# Patient Record
Sex: Female | Born: 1980 | Race: White | Hispanic: No | Marital: Married | Smoking: Never smoker
Health system: Southern US, Community
[De-identification: ages and names within clinical notes are randomized; demographics above are authoritative.]

## PROBLEM LIST (undated history)

## (undated) DIAGNOSIS — J45909 Unspecified asthma, uncomplicated: Secondary | ICD-10-CM

## (undated) DIAGNOSIS — B019 Varicella without complication: Secondary | ICD-10-CM

## (undated) DIAGNOSIS — F32A Depression, unspecified: Secondary | ICD-10-CM

## (undated) DIAGNOSIS — D649 Anemia, unspecified: Secondary | ICD-10-CM

## (undated) DIAGNOSIS — F329 Major depressive disorder, single episode, unspecified: Secondary | ICD-10-CM

## (undated) DIAGNOSIS — K5080 Crohn's disease of both small and large intestine without complications: Secondary | ICD-10-CM

## (undated) DIAGNOSIS — T7840XA Allergy, unspecified, initial encounter: Secondary | ICD-10-CM

## (undated) DIAGNOSIS — Z8669 Personal history of other diseases of the nervous system and sense organs: Secondary | ICD-10-CM

## (undated) DIAGNOSIS — R011 Cardiac murmur, unspecified: Secondary | ICD-10-CM

## (undated) HISTORY — DX: Cardiac murmur, unspecified: R01.1

## (undated) HISTORY — DX: Personal history of other diseases of the nervous system and sense organs: Z86.69

## (undated) HISTORY — PX: COLONOSCOPY W/ BIOPSIES: SHX1374

## (undated) HISTORY — DX: Unspecified asthma, uncomplicated: J45.909

## (undated) HISTORY — PX: COLONOSCOPY: SHX174

## (undated) HISTORY — DX: Major depressive disorder, single episode, unspecified: F32.9

## (undated) HISTORY — DX: Anemia, unspecified: D64.9

## (undated) HISTORY — PX: MOUTH SURGERY: SHX715

## (undated) HISTORY — PX: MULTIPLE TOOTH EXTRACTIONS: SHX2053

## (undated) HISTORY — DX: Allergy, unspecified, initial encounter: T78.40XA

## (undated) HISTORY — DX: Depression, unspecified: F32.A

## (undated) HISTORY — DX: Varicella without complication: B01.9

## (undated) HISTORY — DX: Crohn's disease of both small and large intestine without complications: K50.80

---

## 1999-04-05 ENCOUNTER — Other Ambulatory Visit: Admission: RE | Admit: 1999-04-05 | Discharge: 1999-04-05 | Payer: Self-pay | Admitting: *Deleted

## 1999-12-14 ENCOUNTER — Ambulatory Visit: Admission: RE | Admit: 1999-12-14 | Discharge: 1999-12-14 | Payer: Self-pay | Admitting: Family Medicine

## 2002-08-18 ENCOUNTER — Other Ambulatory Visit: Admission: RE | Admit: 2002-08-18 | Discharge: 2002-08-18 | Payer: Self-pay | Admitting: Obstetrics and Gynecology

## 2003-07-05 ENCOUNTER — Other Ambulatory Visit: Admission: RE | Admit: 2003-07-05 | Discharge: 2003-07-05 | Payer: Self-pay | Admitting: Obstetrics and Gynecology

## 2004-07-16 ENCOUNTER — Other Ambulatory Visit: Admission: RE | Admit: 2004-07-16 | Discharge: 2004-07-16 | Payer: Self-pay | Admitting: Obstetrics and Gynecology

## 2005-08-21 ENCOUNTER — Other Ambulatory Visit: Admission: RE | Admit: 2005-08-21 | Discharge: 2005-08-21 | Payer: Self-pay | Admitting: Obstetrics and Gynecology

## 2006-07-28 ENCOUNTER — Other Ambulatory Visit: Admission: RE | Admit: 2006-07-28 | Discharge: 2006-07-28 | Payer: Self-pay | Admitting: Obstetrics and Gynecology

## 2009-11-23 ENCOUNTER — Inpatient Hospital Stay (HOSPITAL_COMMUNITY): Admission: AD | Admit: 2009-11-23 | Discharge: 2009-11-23 | Payer: Self-pay | Admitting: Obstetrics and Gynecology

## 2010-01-02 ENCOUNTER — Inpatient Hospital Stay (HOSPITAL_COMMUNITY): Admission: AD | Admit: 2010-01-02 | Discharge: 2010-01-07 | Payer: Self-pay | Admitting: Obstetrics and Gynecology

## 2010-01-04 ENCOUNTER — Encounter (HOSPITAL_COMMUNITY): Payer: Self-pay | Admitting: Obstetrics and Gynecology

## 2010-01-07 ENCOUNTER — Encounter: Admission: RE | Admit: 2010-01-07 | Discharge: 2010-02-06 | Payer: Self-pay | Admitting: Obstetrics and Gynecology

## 2010-02-07 ENCOUNTER — Encounter: Admission: RE | Admit: 2010-02-07 | Discharge: 2010-03-09 | Payer: Self-pay | Admitting: Obstetrics and Gynecology

## 2010-03-10 ENCOUNTER — Encounter: Admission: RE | Admit: 2010-03-10 | Discharge: 2010-04-09 | Payer: Self-pay | Admitting: Obstetrics and Gynecology

## 2010-04-10 ENCOUNTER — Encounter: Admission: RE | Admit: 2010-04-10 | Discharge: 2010-05-10 | Payer: Self-pay | Admitting: Obstetrics and Gynecology

## 2010-05-11 ENCOUNTER — Encounter: Admission: RE | Admit: 2010-05-11 | Discharge: 2010-06-10 | Payer: Self-pay | Admitting: Obstetrics and Gynecology

## 2010-06-11 ENCOUNTER — Encounter: Admission: RE | Admit: 2010-06-11 | Discharge: 2010-06-13 | Payer: Self-pay | Admitting: Obstetrics and Gynecology

## 2010-07-12 ENCOUNTER — Encounter
Admission: RE | Admit: 2010-07-12 | Discharge: 2010-08-11 | Payer: Self-pay | Source: Home / Self Care | Admitting: Obstetrics and Gynecology

## 2010-08-12 ENCOUNTER — Encounter
Admission: RE | Admit: 2010-08-12 | Discharge: 2010-08-31 | Payer: Self-pay | Source: Home / Self Care | Attending: Obstetrics and Gynecology | Admitting: Obstetrics and Gynecology

## 2010-10-14 ENCOUNTER — Encounter (HOSPITAL_COMMUNITY): Payer: Self-pay | Admitting: Obstetrics and Gynecology

## 2010-12-11 LAB — CBC
HCT: 29.7 % — ABNORMAL LOW (ref 36.0–46.0)
Hemoglobin: 10 g/dL — ABNORMAL LOW (ref 12.0–15.0)
MCHC: 33.6 g/dL (ref 30.0–36.0)
MCV: 89.9 fL (ref 78.0–100.0)
WBC: 17.8 10*3/uL — ABNORMAL HIGH (ref 4.0–10.5)

## 2010-12-12 LAB — CBC
HCT: 36.6 % (ref 36.0–46.0)
Hemoglobin: 12.2 g/dL (ref 12.0–15.0)
MCV: 88.9 fL (ref 78.0–100.0)
RBC: 4.12 MIL/uL (ref 3.87–5.11)
RDW: 14.5 % (ref 11.5–15.5)

## 2010-12-12 LAB — URINALYSIS, MICROSCOPIC ONLY
Bilirubin Urine: NEGATIVE
Nitrite: NEGATIVE
Protein, ur: NEGATIVE mg/dL
Specific Gravity, Urine: <1.005 — ABNORMAL LOW (ref 1.005–1.030)

## 2010-12-12 LAB — SAMPLE TO BLOOD BANK

## 2010-12-17 LAB — FETAL FIBRONECTIN: Fetal Fibronectin: NEGATIVE

## 2012-05-04 ENCOUNTER — Encounter: Payer: Self-pay | Admitting: Internal Medicine

## 2012-06-03 ENCOUNTER — Other Ambulatory Visit (INDEPENDENT_AMBULATORY_CARE_PROVIDER_SITE_OTHER): Payer: 59

## 2012-06-03 ENCOUNTER — Ambulatory Visit (INDEPENDENT_AMBULATORY_CARE_PROVIDER_SITE_OTHER): Payer: 59 | Admitting: Internal Medicine

## 2012-06-03 ENCOUNTER — Encounter: Payer: Self-pay | Admitting: Internal Medicine

## 2012-06-03 VITALS — BP 114/66 | HR 64 | Ht 66.0 in | Wt 140.8 lb

## 2012-06-03 DIAGNOSIS — K5080 Crohn's disease of both small and large intestine without complications: Secondary | ICD-10-CM

## 2012-06-03 DIAGNOSIS — Z92241 Personal history of systemic steroid therapy: Secondary | ICD-10-CM

## 2012-06-03 HISTORY — DX: Crohn's disease of both small and large intestine without complications: K50.80

## 2012-06-03 LAB — CBC WITH DIFFERENTIAL/PLATELET
Eosinophils Absolute: 0.1 10*3/uL (ref 0.0–0.7)
HCT: 40 % (ref 36.0–46.0)
MCHC: 32.5 g/dL (ref 30.0–36.0)
MCV: 88.3 fl (ref 78.0–100.0)
Monocytes Relative: 8.9 % (ref 3.0–12.0)
Neutro Abs: 2.9 10*3/uL (ref 1.4–7.7)
Neutrophils Relative %: 47 % (ref 43.0–77.0)
RBC: 4.53 Mil/uL (ref 3.87–5.11)
WBC: 6.2 10*3/uL (ref 4.5–10.5)

## 2012-06-03 MED ORDER — MESALAMINE 1.2 G PO TBEC: 1.2000 g | DELAYED_RELEASE_TABLET | Freq: Two times a day (BID) | ORAL | Status: DC

## 2012-06-03 MED ORDER — MESALAMINE 1.2 G PO TBEC: 2400.0000 mg | DELAYED_RELEASE_TABLET | Freq: Every day | ORAL | Status: DC

## 2012-06-03 NOTE — Progress Notes (Signed)
Subjective:    Patient ID: Shannon Singh, female    DOB: 1981/09/05, 31 y.o.   MRN: [id number]  HPI This is a very pleasant 31 year old married white woman with a history of Crohn's disease. She had problems with diarrhea and abdominal pain starting around 2006 or 2007. She in for colonoscopy on 01/28/2007. Findings showed colitis from about 10-40 cm with patches of ulcerations and erosions, 2 erosions in the mid right colon, a few erosions in the cecum, and a small erosions in the terminal ileum, just to. Biopsies confirmed inflammatory bowel disease. There was some question as to whether this was Crohn's disease or ulcerative colitis with backwash ileitis. She was treated with mesalamine and did have some episodes of needing steroids in the first year or 2. Since then released more recently she has been on Lialda 2.4 g daily. When she developed joint pains in the knees and hips she will then ulcer have diarrhea and take 4.8 g daily for about a week. She's had to do that perhaps twice in the last year. She is not having diarrhea or rectal bleeding now. She has not had any high manifestations were skin manifestations of inflammatory bowel disease. In general she feels like she is in good health and only needs to see primary care when having problems.  Her primary care physician is Dr. Rachell Cipro and her gynecologist is Dr. Marylynn Pearson area  GI review of systems is otherwise negative.  No Known Allergies Outpatient Prescriptions Prior to Visit  Medication Sig Dispense Refill  . cetirizine (ZYRTEC) 10 MG tablet Take 10 mg by mouth daily.      . drospirenone-ethinyl estradiol (YASMIN,ZARAH,SYEDA) 3-0.03 MG tablet Take 1 tablet by mouth daily.      . mesalamine (LIALDA) 1.2 G EC tablet Take 1,200 mg by mouth daily with breakfast.       Past Medical History  Diagnosis Date  . History of migraine headaches   . Chicken pox   . Asthma   . Depression   . Heart murmur    Past Surgical  History  Procedure Date  . Colonoscopy w/ biopsies   . Cesarean section 12/2009    twins   History   Social History  . Marital Status: Married    Spouse Name: N/A    Number of Children: 2  .     Occupational History  . Stage manager  .     Social History Main Topics  . Smoking status: Never Smoker   . Smokeless tobacco: Never Used  . Alcohol Use: Yes     mild  . Drug Use: No    Social History Narrative   Married, and children born 2011, 1 son 1 daughterEmployed in [employer]   Family History  Problem Relation Age of Onset  . Colon polyps Father   . Prostate cancer Maternal Grandfather   . Breast cancer Neg Hx   . Colon cancer Neg Hx   . Ovarian cancer Neg Hx   . Heart disease Maternal Grandmother    Review of Systems All other systems reviewed are negative except as per history of present illness    Objective:   Physical Exam General:  Well-developed, well-nourished and in no acute distress Eyes:  anicteric. ENT:   Mouth and posterior pharynx free of lesions.  Neck:   supple w/o thyromegaly or mass.  Lungs: Clear to auscultation bilaterally. Heart:  S1S2, no rubs, murmurs, gallops. Abdomen:  soft, non-tender, no hepatosplenomegaly, hernia, or mass and BS+.  Lymph:  no cervical or supraclavicular adenopathy. Extremities:   no edema Skin   no rash. Neuro:  A&O x 3.  Psych:  appropriate mood and  Affect.   Data Reviewed: [place] [facility] colonoscopy and pathology reports Office records from Dr. Collene Mares, previous gastroenterologist     Assessment & Plan:   1. Crohn's ileocolitis   2. Personal history of systemic steroid therapy    1. She seems to be well controlled at this point. On low-dose mesalamine therapy. 2.4 g Lialda daily 2. She is coming up on about 8 years of disease. She may have about a third of the colon involved at least on the index colonoscopy. A colonoscopy is reasonable given that and a sense for screening purposes but  also to see response to treatment and whether or not she has active endoscopic disease despite being symptom-free. This could lead to a change in therapy. 3. DEXA scan is also a reasonable option if her baseline in a patient with laboratory bowel disease. She has a history of steroid use as well though fortunately not in some time. 4. Have explained the risks benefits and indications of colonoscopy and she understands and agrees to proceed. I don't think this is urgent. He could schedule later this year early next year depending upon what works best for her. 5. She will look into the DEXA scan and call about scheduling that as well, she wants to check with her insurance currently first. 6. CBC, comprehensive metabolic panel and C-reactive protein will be checked today 7. I have provided the patient with a free one year member said that [employer], if desired  I appreciate the opportunity to care for this patient.  CC: DEWEY,ELIZABETH, MD Marylynn Pearson, MD

## 2012-06-03 NOTE — Patient Instructions (Addendum)
Colonoscopy indication is for colorectal cancer screening - V76.51 and because of your crohn's ileocolitis 555.2  DEXA scan is because of personal history of systemic steroid therapy V 87.45  You may call us back when your ready to set up a colonoscopy and a DEXA scan after you contact your insurance company about coverage.  Your physician has requested that you go to the basement for the following lab work before leaving today: CRP, CBC, CMET  We have sent the following medications to your pharmacy for you to pick up at your convenience: Lialda, and also we are giving you samples of Lialda today.  We are giving you a one year complimentary [employer] membership application form to use if you like.  Thank you for choosing me and [facility].  Gatha Mayer, M.D., [facility]

## 2012-06-04 LAB — COMPREHENSIVE METABOLIC PANEL
AST: 25 U/L (ref 0–37)
BUN: 14 mg/dL (ref 6–23)
Chloride: 105 mEq/L (ref 96–112)
Potassium: 4.1 mEq/L (ref 3.5–5.1)
Total Bilirubin: 0.6 mg/dL (ref 0.3–1.2)
Total Protein: 8 g/dL (ref 6.0–8.3)

## 2012-06-04 NOTE — Progress Notes (Signed)
Quick Note:  Labs are all ok Please mail her a copy after you call her ______

## 2012-08-10 ENCOUNTER — Telehealth: Payer: Self-pay | Admitting: Internal Medicine

## 2012-08-10 NOTE — Telephone Encounter (Signed)
I looked it up and suspect no obvious problems but really do not know. At a BMI of 22 I would advise against weight loss Rx and would also say that there are no good studies to support any health claims of this supplement that I jones, sher can find by searching the internet

## 2012-08-10 NOTE — Telephone Encounter (Signed)
Dr. Carlean Purl is it ok for her take Garcinia Cambogia with her crohn's?  Supposedly helps with weight loss

## 2012-08-11 NOTE — Telephone Encounter (Signed)
Patient aware.  She will call back for any additional questions

## 2012-08-31 DIAGNOSIS — K5090 Crohn's disease, unspecified, without complications: Secondary | ICD-10-CM | POA: Insufficient documentation

## 2013-06-29 ENCOUNTER — Telehealth: Payer: Self-pay | Admitting: Internal Medicine

## 2013-06-29 DIAGNOSIS — K5080 Crohn's disease of both small and large intestine without complications: Secondary | ICD-10-CM

## 2013-06-29 MED ORDER — MESALAMINE 1.2 G PO TBEC: 2400.0000 mg | DELAYED_RELEASE_TABLET | Freq: Every day | ORAL | Status: DC

## 2013-06-29 NOTE — Telephone Encounter (Signed)
Refill sent in as requested to [facility] in [place].C.

## 2013-07-26 ENCOUNTER — Telehealth: Payer: Self-pay

## 2013-07-26 ENCOUNTER — Other Ambulatory Visit (INDEPENDENT_AMBULATORY_CARE_PROVIDER_SITE_OTHER): Payer: 59

## 2013-07-26 ENCOUNTER — Ambulatory Visit (INDEPENDENT_AMBULATORY_CARE_PROVIDER_SITE_OTHER)
Admission: RE | Admit: 2013-07-26 | Discharge: 2013-07-26 | Disposition: A | Discharge status: Home / Self Care | Payer: 59 | Source: Ambulatory Visit | Attending: Internal Medicine | Admitting: Internal Medicine

## 2013-07-26 ENCOUNTER — Ambulatory Visit (INDEPENDENT_AMBULATORY_CARE_PROVIDER_SITE_OTHER): Payer: 59 | Admitting: Internal Medicine

## 2013-07-26 ENCOUNTER — Encounter: Payer: Self-pay | Admitting: Internal Medicine

## 2013-07-26 VITALS — BP 108/64 | HR 64 | Ht 66.0 in | Wt 155.2 lb

## 2013-07-26 DIAGNOSIS — Z92241 Personal history of systemic steroid therapy: Secondary | ICD-10-CM

## 2013-07-26 DIAGNOSIS — K5080 Crohn's disease of both small and large intestine without complications: Secondary | ICD-10-CM

## 2013-07-26 DIAGNOSIS — M25551 Pain in right hip: Secondary | ICD-10-CM

## 2013-07-26 DIAGNOSIS — M25559 Pain in unspecified hip: Secondary | ICD-10-CM

## 2013-07-26 LAB — CBC WITH DIFFERENTIAL/PLATELET
Basophils Absolute: 0 10*3/uL (ref 0.0–0.1)
Eosinophils Absolute: 0.3 10*3/uL (ref 0.0–0.7)
Eosinophils Relative: 3.6 % (ref 0.0–5.0)
Hemoglobin: 13.1 g/dL (ref 12.0–15.0)
Monocytes Absolute: 0.6 10*3/uL (ref 0.1–1.0)
Neutro Abs: 5.5 10*3/uL (ref 1.4–7.7)
Neutrophils Relative %: 56.2 % (ref 43.0–77.0)
Platelets: 355 10*3/uL (ref 150.0–400.0)
RBC: 4.52 Mil/uL (ref 3.87–5.11)
RDW: 14.1 % (ref 11.5–14.6)
WBC: 9.7 10*3/uL (ref 4.5–10.5)

## 2013-07-26 LAB — COMPREHENSIVE METABOLIC PANEL
AST: 28 U/L (ref 0–37)
BUN: 15 mg/dL (ref 6–23)
CO2: 27 mEq/L (ref 19–32)
GFR: 102.08 mL/min (ref >60.00)
Total Protein: 7.5 g/dL (ref 6.0–8.3)

## 2013-07-26 LAB — C-REACTIVE PROTEIN: CRP: 0.6 mg/dL (ref 0.5–20.0)

## 2013-07-26 MED ORDER — NA SULFATE-K SULFATE-MG SULF 17.5-3.13-1.6 GM/177ML PO SOLN: ORAL | Status: DC

## 2013-07-26 NOTE — Assessment & Plan Note (Signed)
Question if is related to Crohn's, she has a history of steroid use, will start with hip and pelvis films.

## 2013-07-26 NOTE — Progress Notes (Signed)
  Subjective:    Patient ID: Shannon Singh, female    DOB: Sep 30, 1980, 32 y.o.   MRN: [id number]  HPI    Review of Systems     Objective:   Physical Exam General:  NAD Eyes:   anicteric Lungs:  clear Heart:  S1S2 no rubs, murmurs or gallops Abdomen:  soft and nontender, BS+ Ext:   no edema, hips mobile and w/o pain, negative straight leg raise    Data Reviewed:          Assessment & Plan:

## 2013-07-26 NOTE — Patient Instructions (Addendum)
Your physician has requested that you go to the basement for the following lab work before leaving today: CMET, CBC/diff, C-reactive protein, vitamin D level  You have been scheduled for a colonoscopy with propofol. Please follow written instructions given to you at your visit today.  Please pick up your prep kit at the pharmacy within the next 1-3 days. If you use inhalers (even only as needed), please bring them with you on the day of your procedure. Your physician has requested that you go to www.startemmi.com and enter the access code given to you at your visit today. This web site gives a general overview about your procedure. However, you should still follow specific instructions given to you by our office regarding your preparation for the procedure.  Please go to the basement for hip films today. We want you to have a DEXA scan also, this is set for 07/30/13 at 9 AM.  I appreciate the opportunity to care for you.

## 2013-07-26 NOTE — Telephone Encounter (Signed)
Left detailed message for patient to call back, need to pick another time for her colon, the 08/17/13 slot at 2:30pm taken.  I can type up new directions and put them up front for pick up, she has DEXA appt in our building on 07/30/2013 at 9am.

## 2013-07-26 NOTE — Assessment & Plan Note (Addendum)
Schedule colonoscopy, she may be one of those it needs to have regular colonoscopy depending upon about colonic involvement she has. I think that's a good time to followup on disease status. We'll go at the CBC, comprehensive metabolic panel, C-reactive protein and a vitamin D level today.

## 2013-07-26 NOTE — Assessment & Plan Note (Signed)
DEXA scan is appropriate given this and or history of Crohn's disease. Will schedule.

## 2013-07-27 ENCOUNTER — Telehealth: Payer: Self-pay

## 2013-07-27 LAB — VITAMIN D 25 HYDROXY (VIT D DEFICIENCY, FRACTURES): Vit D, 25-Hydroxy: 32 ng/mL (ref 30–89)

## 2013-07-27 NOTE — Telephone Encounter (Signed)
Spoke to patient and informed her that date chosen yesterday for colonoscopy not available.  Picked another date 09/02/13 at 3:30pm.  New paperwork will be mailed to her and she is to call with any questions once received .

## 2013-07-28 ENCOUNTER — Encounter: Payer: Self-pay | Admitting: Internal Medicine

## 2013-07-30 ENCOUNTER — Ambulatory Visit (INDEPENDENT_AMBULATORY_CARE_PROVIDER_SITE_OTHER)
Admission: RE | Admit: 2013-07-30 | Discharge: 2013-07-30 | Disposition: A | Discharge status: Home / Self Care | Payer: 59 | Source: Ambulatory Visit | Attending: Internal Medicine | Admitting: Internal Medicine

## 2013-07-30 DIAGNOSIS — Z92241 Personal history of systemic steroid therapy: Secondary | ICD-10-CM

## 2013-07-30 NOTE — Progress Notes (Signed)
Quick Note:  Labs and xrays all ok Will discus more at colonoscpy ______

## 2013-08-02 NOTE — Progress Notes (Signed)
Quick Note:  Bone density normal Please let her know ______

## 2013-09-02 ENCOUNTER — Encounter: Payer: Self-pay | Admitting: Internal Medicine

## 2013-09-02 ENCOUNTER — Ambulatory Visit (AMBULATORY_SURGERY_CENTER): Payer: 59 | Admitting: Internal Medicine

## 2013-09-02 VITALS — BP 107/59 | HR 70 | Temp 98.5°F | Resp 19 | Ht 66.0 in | Wt 155.0 lb

## 2013-09-02 DIAGNOSIS — K5080 Crohn's disease of both small and large intestine without complications: Secondary | ICD-10-CM

## 2013-09-02 MED ORDER — SODIUM CHLORIDE 0.9 % IV SOLN: 500.0000 mL | INTRAVENOUS | Status: DC

## 2013-09-02 NOTE — Progress Notes (Signed)
Report to pacu rn, vss, bbs=clear 

## 2013-09-02 NOTE — Op Note (Signed)
[name]  [address] [place], [id number]   COLONOSCOPY PROCEDURE REPORT  PATIENT: Shannon Singh, Shannon Singh  MR#: [id number] BIRTHDATE: Mar 30, 1981 , 32  yrs. old GENDER: Female ENDOSCOPIST: Gatha Mayer, MD, [facility] PROCEDURE DATE:  09/02/2013 PROCEDURE:   Colonoscopy, diagnostic First Screening Colonoscopy - Avg.  risk and is 50 yrs.  old or older - No.  Prior Negative Screening - Now for repeat screening. N/A  History of Adenoma - Now for follow-up colonoscopy & has been > or = to 3 yrs.  N/A  Polyps Removed Today? No.  Recommend repeat exam, <10 yrs? No. ASA CLASS:   Class II INDICATIONS:High risk patient with previously diagnosed Crohn's Disease. MEDICATIONS: propofol (Diprivan) 370m IV, MAC sedation, administered by CRNA, and These medications were titrated to patient response per physician's verbal order  DESCRIPTION OF PROCEDURE:   After the risks benefits and alternatives of the procedure were thoroughly explained, informed consent was obtained.  A digital rectal exam revealed no abnormalities of the rectum.   The LB [id number] endoscope was introduced through the anus and advanced to the terminal ileum which was intubated for a short distance. No adverse events experienced.   The quality of the prep was excellent using Suprep The instrument was then slowly withdrawn as the colon was fully examined.  COLON FINDINGS: The mucosa appeared normal in the terminal ileum. The colonic mucosa appeared normal throughout the entire examined colon.  Retroflexed views revealed no abnormalities. The time to cecum=1 minutes 45 seconds.  Withdrawal time=10 minutes 29 seconds. The scope was withdrawn and the procedure completed. COMPLICATIONS: There were no complications.  ENDOSCOPIC IMPRESSION: 1.   Normal mucosa in the terminal ileum 2.   The colonic mucosa appeared normal throughout the entire examined colon Crohn's ileocolitis is in remission on  Lialda  RECOMMENDATIONS: return office visit 1 year (Nov/Dec 2015) Colonoscopy routine at age 32 sooner if needed for signs or symptoms   eSigned:  CGatha Mayer MD, F[facility]12/07/2013 4:32 PM   cc: The Patient and ERachell Cipro MD

## 2013-09-02 NOTE — Progress Notes (Addendum)
1450 pt. Became very faint, pale after IV started. Stated "I don't do well with this".  HOB lowered, cool cloth to forehead, fanned, continued  To speak with staff. 1500 BP 110/60, pulse 60, remains pale, smiles and laughs with staff.   1510 Pt. Request to have head raised a bit. Color better, no complaints.

## 2013-09-02 NOTE — Patient Instructions (Addendum)
No signs of Crohn's disease at all - in remission! Keep up with the [place] and se me in 1 year.  I would wait until age 32 for a routine colonoscopy.  I appreciate the opportunity to care for you. Gatha Mayer, MD, FACG  YOU HAD AN ENDOSCOPIC PROCEDURE TODAY AT [facility]: Refer to the procedure report that was given to you for any specific questions about what was found during the examination.  If the procedure report does not answer your questions, please call your gastroenterologist to clarify.  If you requested that your care partner not be given the details of your procedure findings, then the procedure report has been included in a sealed envelope for you to review at your convenience later.  YOU SHOULD EXPECT: Some feelings of bloating in the abdomen. Passage of more gas than usual.  Walking can help get rid of the air that was put into your GI tract during the procedure and reduce the bloating. If you had a lower endoscopy (such as a colonoscopy or flexible sigmoidoscopy) you may notice spotting of blood in your stool or on the toilet paper. If you underwent a bowel prep for your procedure, then you may not have a normal bowel movement for a few days.  DIET: Your first meal following the procedure should be a light meal and then it is ok to progress to your normal diet.  A half-sandwich or bowl of soup is an example of a good first meal.  Heavy or fried foods are harder to digest and may make you feel nauseous or bloated.  Likewise meals heavy in dairy and vegetables can cause extra gas to form and this can also increase the bloating.  Drink plenty of fluids but you should avoid alcoholic beverages for 24 hours.  ACTIVITY: Your care partner should take you home directly after the procedure.  You should plan to take it easy, moving slowly for the rest of the day.  You can resume normal activity the day after the procedure however you should NOT DRIVE or use heavy machinery  for 24 hours (because of the sedation medicines used during the test).    SYMPTOMS TO REPORT IMMEDIATELY: A gastroenterologist can be reached at any hour.  During normal business hours, 8:30 AM to 5:00 PM Monday through Friday, call [phone].  After hours and on weekends, please call the GI answering service at [phone] who will take a message and have the physician on call contact you.   Following lower endoscopy (colonoscopy or flexible sigmoidoscopy):  Excessive amounts of blood in the stool  Significant tenderness or worsening of abdominal pains  Swelling of the abdomen that is new, acute  Fever of 100F or higher  FOLLOW UP: Our staff will call the home number listed on your records the next business day following your procedure to check on you and address any questions or concerns that you may have at that time regarding the information given to you following your procedure. This is a courtesy call and so if there is no answer at the home number and we have not heard from you through the emergency physician on call, we will assume that you have returned to your regular daily activities without incident.  SIGNATURES/CONFIDENTIALITY: You and/or your care partner have signed paperwork which will be entered into your electronic medical record.  These signatures attest to the fact that that the information above on your After Visit Summary has been  reviewed and is understood.  Full responsibility of the confidentiality of this discharge information lies with you and/or your care-partner.  Please continue your normal medications and follow Dr. Celesta Aver  recommendations

## 2013-09-02 NOTE — Progress Notes (Signed)
Patient did not experience any of the following events: a burn prior to discharge; a fall within the facility; wrong site/side/patient/procedure/implant event; or a hospital transfer or hospital admission upon discharge from the facility. (G8907) Patient did not have preoperative order for IV antibiotic SSI prophylaxis. (G8918)  

## 2013-09-03 ENCOUNTER — Telehealth: Payer: Self-pay | Admitting: *Deleted

## 2013-09-03 NOTE — Telephone Encounter (Signed)
  Follow up Call-  Call back number 09/02/2013  Post procedure Call Back phone  # [phone]  Permission to leave phone message Yes     Patient questions:  Do you have a fever, pain , or abdominal swelling? no Pain Score  0 *  Have you tolerated food without any problems? yes  Have you been able to return to your normal activities? yes  Do you have any questions about your discharge instructions: Diet   no Medications  no Follow up visit  no  Do you have questions or concerns about your Care? no  Actions: * If pain score is 4 or above: No action needed, pain <4.

## 2013-09-30 ENCOUNTER — Other Ambulatory Visit: Payer: Self-pay | Admitting: Internal Medicine

## 2014-03-15 ENCOUNTER — Telehealth: Payer: Self-pay | Admitting: Internal Medicine

## 2014-03-15 NOTE — Telephone Encounter (Signed)
Patient reports that for the last several weeks she has had mucous, occasional rectal bleeding, bloating, urgency and loose stools.  Patient will come in and see Alonza Bogus, PA on 03/18/14 2:00

## 2014-03-18 ENCOUNTER — Ambulatory Visit (INDEPENDENT_AMBULATORY_CARE_PROVIDER_SITE_OTHER): Payer: 59 | Admitting: Gastroenterology

## 2014-03-18 ENCOUNTER — Other Ambulatory Visit (INDEPENDENT_AMBULATORY_CARE_PROVIDER_SITE_OTHER): Payer: 59

## 2014-03-18 ENCOUNTER — Encounter: Payer: Self-pay | Admitting: Gastroenterology

## 2014-03-18 VITALS — BP 112/72 | HR 68 | Ht 66.0 in | Wt 160.0 lb

## 2014-03-18 DIAGNOSIS — K5090 Crohn's disease, unspecified, without complications: Secondary | ICD-10-CM

## 2014-03-18 DIAGNOSIS — K5080 Crohn's disease of both small and large intestine without complications: Secondary | ICD-10-CM

## 2014-03-18 LAB — COMPREHENSIVE METABOLIC PANEL
ALK PHOS: 82 U/L (ref 39–117)
ALT: 19 U/L (ref 0–35)
AST: 22 U/L (ref 0–37)
Albumin: 4.2 g/dL (ref 3.5–5.2)
BILIRUBIN TOTAL: 0.1 mg/dL — AB (ref 0.2–1.2)
BUN: 18 mg/dL (ref 6–23)
CO2: 29 mEq/L (ref 19–32)
Calcium: 9.4 mg/dL (ref 8.4–10.5)
Chloride: 102 mEq/L (ref 96–112)
Creatinine, Ser: 0.8 mg/dL (ref 0.4–1.2)
GFR: 89.75 mL/min (ref >60.00)
GLUCOSE: 120 mg/dL — AB (ref 70–99)
POTASSIUM: 3.9 meq/L (ref 3.5–5.1)
Sodium: 138 mEq/L (ref 135–145)
Total Protein: 7.7 g/dL (ref 6.0–8.3)

## 2014-03-18 LAB — CBC
HCT: 38.5 % (ref 36.0–46.0)
Hemoglobin: 12.9 g/dL (ref 12.0–15.0)
MCHC: 33.5 g/dL (ref 30.0–36.0)
MCV: 87.2 fl (ref 78.0–100.0)
Platelets: 331 10*3/uL (ref 150.0–400.0)
RBC: 4.42 Mil/uL (ref 3.87–5.11)
RDW: 14.6 % (ref 11.5–15.5)
WBC: 9.5 10*3/uL (ref 4.0–10.5)

## 2014-03-18 LAB — SEDIMENTATION RATE: SED RATE: 18 mm/h (ref 0–22)

## 2014-03-18 LAB — C-REACTIVE PROTEIN: CRP: 0.9 mg/dL (ref 0.5–20.0)

## 2014-03-18 MED ORDER — MESALAMINE 1.2 G PO TBEC: DELAYED_RELEASE_TABLET | ORAL | Status: DC

## 2014-03-18 NOTE — Patient Instructions (Signed)
We have sent the following medications to your pharmacy for you to pick up at your convenience: Lialda, take four tablets by mouth once daily   Your physician has requested that you go to the basement for the following lab work before leaving today: CBC CMP CRP Sed Rate

## 2014-03-18 NOTE — Progress Notes (Signed)
     03/18/2014 Shannon Singh [id number] 1980/10/26   History of Present Illness:  This is a pleasant 33 year old female who is known to Dr. Carlean Purl for treatment of her Crohn's ileocolitis. She was initially diagnosed on colonoscopy in 2008 by Dr. Collene Mares. At that time she was found to have colitis from about 10-40 cm with patches of ulcerations and erosions, 2 erosions in the mid right colon, a few erosions in the cecum, and small erosions and the terminal ileum. Colonoscopy with ileoscopy in December 2014 by Dr. Carlean Purl was normal and showed that her disease was in remission. At the time of her colonoscopy she was taking Lialda 2.4 g daily. That is the only medication that she's ever been treated with except for requiring steroids on one occasion by Dr. Collene Mares previously. She presents to our office today stating that for the past couple of weeks she has been seeing a small amount of blood with some bowel movements, but not all bowel movements. She has some mucus as well. Also complains of bloating and some loose stools with urgency. Stools are looser, but not necessarily diarrhea. She is having sometimes 4-5 bowel movements a day compared to her normal one or 2 per day. Previously with very mild transient flares she has increased her Lialda to 4 pills dialy, but has not yet done that on this occasion. She would overall like to avoid prednisone/steroids if possible but is willing to take them if needed.   Current Medications, Allergies, Past Medical History, Past Surgical History, Family History and Social History were reviewed in [employer] record.   Physical Exam: BP 112/72  Pulse 68  Ht 5' 6"  (1.676 m)  Wt 160 lb (72.576 kg)  BMI 25.84 kg/m2  LMP 03/11/2014 General: Well developed white female in no acute distress Head: Normocephalic and atraumatic Eyes:  Sclerae anicteric, conjunctiva pink  Ears: Normal auditory acuity Lungs: Clear throughout to auscultation Heart:  Regular rate and rhythm Abdomen: Soft, non-distended.  Normal bowel sounds.  Mild RLQ TTP without R/R/G. Musculoskeletal: Symmetrical with no gross deformities  Extremities: No edema  Neurological: Alert oriented x 4, grossly non-focal Psychological:  Alert and cooperative. Normal mood and affect  Assessment and Recommendations: -Crohn's ileocolitis with flare:  At this point she would like to avoid steroids/prednisone if possible. We will start by increasing her Lialda to 4 pills daily (4.8 grams), which has helped her with mild flares in the past. We will send over a new prescription. We will check labs including a CBC, CMP, sedimentation rate, and CRP. Pending those results and the response of her symptoms to the increase in her medication, she may need addition of prednisone taper, we will hold off on that for now. She will call our office within the next week to 10 days if no improvement.

## 2014-03-21 ENCOUNTER — Other Ambulatory Visit: Payer: Self-pay | Admitting: *Deleted

## 2014-03-21 MED ORDER — PREDNISONE 10 MG PO TABS: ORAL_TABLET | ORAL | Status: DC

## 2014-03-22 NOTE — Progress Notes (Signed)
Agree and see that she was also placed on a prednisone taper. She needs an appointment to see me in about 1 month to 6 weeks - ok to use an overbook spot. Am ccing Idalia Needle, LPN to arrange appt

## 2014-03-23 ENCOUNTER — Telehealth: Payer: Self-pay

## 2014-03-23 NOTE — Telephone Encounter (Signed)
Patient contacted and scheduled for a follow up appt 05/23/14 at [place] with Dr Carlean Purl

## 2014-05-23 ENCOUNTER — Ambulatory Visit: Payer: 59 | Admitting: Internal Medicine

## 2014-05-23 ENCOUNTER — Telehealth: Payer: Self-pay | Admitting: Internal Medicine

## 2014-06-22 NOTE — Telephone Encounter (Signed)
See previous encounter

## 2014-07-25 ENCOUNTER — Other Ambulatory Visit: Payer: Self-pay | Admitting: Obstetrics and Gynecology

## 2014-07-26 ENCOUNTER — Other Ambulatory Visit (INDEPENDENT_AMBULATORY_CARE_PROVIDER_SITE_OTHER): Payer: 59

## 2014-07-26 ENCOUNTER — Telehealth: Payer: Self-pay

## 2014-07-26 ENCOUNTER — Encounter: Payer: Self-pay | Admitting: Internal Medicine

## 2014-07-26 ENCOUNTER — Ambulatory Visit (INDEPENDENT_AMBULATORY_CARE_PROVIDER_SITE_OTHER): Payer: 59 | Admitting: Internal Medicine

## 2014-07-26 VITALS — BP 104/68 | HR 60 | Ht 66.0 in | Wt 167.5 lb

## 2014-07-26 DIAGNOSIS — K50811 Crohn's disease of both small and large intestine with rectal bleeding: Secondary | ICD-10-CM

## 2014-07-26 LAB — CYTOLOGY - PAP

## 2014-07-26 LAB — CBC WITH DIFFERENTIAL/PLATELET
BASOS PCT: 0.6 % (ref 0.0–3.0)
Basophils Absolute: 0 10*3/uL (ref 0.0–0.1)
EOS PCT: 4.1 % (ref 0.0–5.0)
Eosinophils Absolute: 0.3 10*3/uL (ref 0.0–0.7)
HCT: 40.4 % (ref 36.0–46.0)
Hemoglobin: 13.3 g/dL (ref 12.0–15.0)
LYMPHS PCT: 38.4 % (ref 12.0–46.0)
Lymphs Abs: 3.1 10*3/uL (ref 0.7–4.0)
MCHC: 33 g/dL (ref 30.0–36.0)
MCV: 87.1 fl (ref 78.0–100.0)
MONOS PCT: 5.6 % (ref 3.0–12.0)
Monocytes Absolute: 0.5 10*3/uL (ref 0.1–1.0)
NEUTROS PCT: 51.3 % (ref 43.0–77.0)
Neutro Abs: 4.1 10*3/uL (ref 1.4–7.7)
PLATELETS: 440 10*3/uL — AB (ref 150.0–400.0)
RBC: 4.64 Mil/uL (ref 3.87–5.11)
RDW: 13.9 % (ref 11.5–15.5)
WBC: 8.1 10*3/uL (ref 4.0–10.5)

## 2014-07-26 LAB — C-REACTIVE PROTEIN: CRP: 1.1 mg/dL (ref 0.5–20.0)

## 2014-07-26 LAB — HEPATITIS B SURFACE ANTIGEN: Hepatitis B Surface Ag: NEGATIVE

## 2014-07-26 MED ORDER — DIPHENOXYLATE-ATROPINE 2.5-0.025 MG PO TABS: ORAL_TABLET | ORAL | Status: DC

## 2014-07-26 MED ORDER — BUDESONIDE 3 MG PO CP24: 3.0000 mg | ORAL_CAPSULE | Freq: Every day | ORAL | Status: DC

## 2014-07-26 NOTE — Telephone Encounter (Signed)
Left message that I had faxed her the Lomotil rx to [facility] in [place].  Dr. Carlean Purl spoke to her eariler and told her he meant to give her this when she was here.

## 2014-07-26 NOTE — Assessment & Plan Note (Signed)
Flaring Sounds like active left-sided disease despite high-dose mesalamine  Could need immunomodulators and/or biologics - info given She would like to avoid prednisone Try budesonide 9 mg qd CBC, CRP, TB serology and HB S Ag ? Enema Tx  Symptomatic Lomotil  RTC 2 months

## 2014-07-26 NOTE — Progress Notes (Signed)
   Subjective:    Patient ID: Shannon Singh, female    DOB: 11-26-1980, 33 y.o.   MRN: [id number]  HPI Jaton is here for f/u of Crohn's disease. She was seen by Ms. Zehr for a flare in the summer - took prednisone x 1 month and bloody diarrhea calmed down. Then over past few months started having sxs again and she has been having urgent defecation with diarrhea, tenesmus and bloody mucoid dc. Some crampy abdominal pain. Changed jobs and is under stress with that but overall feels ok re: stress. No diet changes  No rectal or anal c/o No fevers Medications, allergies, past medical history, past surgical history, family history and social history are reviewed and updated in the EMR.    Review of Systems As above    Objective:   Physical Exam  WDWN NAD Eyes anicteric Abdomen is soft with Mild infraumbilical tenderness BS+ Appropriate mood and affect     Assessment & Plan:  Crohn's ileocolitis Flaring Sounds like active left-sided disease despite high-dose mesalamine  Could need immunomodulators and/or biologics - info given She would like to avoid prednisone Try budesonide 9 mg qd CBC, CRP, TB serology and HB S Ag ? Enema Tx  Symptomatic Lomotil  RTC 2 months

## 2014-07-26 NOTE — Patient Instructions (Signed)
Your physician has requested that you go to the basement for the lab work before leaving today.  We have sent the following medications to your pharmacy for you to pick up at your convenience: Entocort  Please follow up with [place] in 2 months.   I appreciate the opportunity to care for you.

## 2014-07-28 LAB — QUANTIFERON TB GOLD ASSAY (BLOOD)
INTERFERON GAMMA RELEASE ASSAY: NEGATIVE
MITOGEN VALUE: 9 [IU]/mL
QUANTIFERON TB AG MINUS NIL: 0.05 [IU]/mL
Quantiferon Nil Value: 0.03 IU/mL
TB AG VALUE: 0.08 [IU]/mL

## 2014-07-29 ENCOUNTER — Encounter: Payer: Self-pay | Admitting: Internal Medicine

## 2014-08-01 ENCOUNTER — Encounter: Payer: Self-pay | Admitting: Internal Medicine

## 2014-08-01 NOTE — Progress Notes (Signed)
Quick Note:  Notified via My Chart To contact me near end of 1st month of Entocort w/ sx update ______

## 2014-09-27 ENCOUNTER — Ambulatory Visit: Payer: 59 | Admitting: Internal Medicine

## 2014-11-15 ENCOUNTER — Ambulatory Visit: Payer: Self-pay | Admitting: Internal Medicine

## 2015-01-04 ENCOUNTER — Other Ambulatory Visit (INDEPENDENT_AMBULATORY_CARE_PROVIDER_SITE_OTHER): Payer: 59

## 2015-01-04 ENCOUNTER — Other Ambulatory Visit: Payer: Self-pay | Admitting: Internal Medicine

## 2015-01-04 ENCOUNTER — Encounter: Payer: Self-pay | Admitting: Internal Medicine

## 2015-01-04 ENCOUNTER — Ambulatory Visit (INDEPENDENT_AMBULATORY_CARE_PROVIDER_SITE_OTHER): Payer: 59 | Admitting: Internal Medicine

## 2015-01-04 VITALS — BP 120/68 | HR 68 | Ht 66.0 in | Wt 158.5 lb

## 2015-01-04 DIAGNOSIS — D649 Anemia, unspecified: Secondary | ICD-10-CM | POA: Insufficient documentation

## 2015-01-04 DIAGNOSIS — K5080 Crohn's disease of both small and large intestine without complications: Secondary | ICD-10-CM | POA: Diagnosis not present

## 2015-01-04 DIAGNOSIS — D473 Essential (hemorrhagic) thrombocythemia: Secondary | ICD-10-CM

## 2015-01-04 DIAGNOSIS — R7989 Other specified abnormal findings of blood chemistry: Secondary | ICD-10-CM

## 2015-01-04 LAB — CBC WITH DIFFERENTIAL/PLATELET
BASOS PCT: 0.4 % (ref 0.0–3.0)
Basophils Absolute: 0 10*3/uL (ref 0.0–0.1)
EOS PCT: 2.7 % (ref 0.0–5.0)
Eosinophils Absolute: 0.3 10*3/uL (ref 0.0–0.7)
HEMATOCRIT: 35.4 % — AB (ref 36.0–46.0)
Hemoglobin: 11.7 g/dL — ABNORMAL LOW (ref 12.0–15.0)
Lymphocytes Relative: 24.7 % (ref 12.0–46.0)
Lymphs Abs: 2.8 10*3/uL (ref 0.7–4.0)
MCHC: 32.9 g/dL (ref 30.0–36.0)
MCV: 82.7 fl (ref 78.0–100.0)
Monocytes Absolute: 0.8 10*3/uL (ref 0.1–1.0)
Monocytes Relative: 7.4 % (ref 3.0–12.0)
NEUTROS ABS: 7.4 10*3/uL (ref 1.4–7.7)
Neutrophils Relative %: 64.8 % (ref 43.0–77.0)
Platelets: 347 10*3/uL (ref 150.0–400.0)
RBC: 4.28 Mil/uL (ref 3.87–5.11)
RDW: 14.7 % (ref 11.5–15.5)
WBC: 11.5 10*3/uL — AB (ref 4.0–10.5)

## 2015-01-04 NOTE — Assessment & Plan Note (Addendum)
Better w/ homeopathathic Tx RTC 1 year sooner prn

## 2015-01-04 NOTE — Patient Instructions (Signed)
  Glad things are going well. Please go to the basement and we will draw a CBC. Results will be sent by My Chart or phone.  I appreciate the opportunity to care for you. Gatha Mayer, MD, [facility]

## 2015-01-04 NOTE — Progress Notes (Signed)
   Subjective:    Patient ID: Shannon Singh, female    DOB: August 18, 1981, 34 y.o.   MRN: [id number] Cc: f/u Crohn;s disease  HPI When last here was not doing well on Lialda and I recommended biologic Tx - looked at Humira and did not want to use that given possible side effects.  She went to a naturopathic/homeopathic practice and is on supplemnts and gluten and dairy-free diets and feels well now. using digestive enzymes and another supplement.  Medications, allergies, past medical history, past surgical history, family history and social history are reviewed and updated in the EMR.   Review of Systems As above has had some stress changing jobs    Objective:   Physical Exam @BP  120/68 mmHg  Pulse 68  Ht 5' 6"  (1.676 m)  Wt 158 lb 8 oz (71.895 kg)  BMI 25.59 kg/m2  LMP 12/07/2014@  General:  NAD WDWN [employer]      Assessment & Plan:  Crohn's ileocolitis Better w/ homeopathathic Tx RTC 1 year sooner prn   Elevated PLT count - she was concerned abouy PLT > 400 K last check - repeat CBC  15 minutes time spent with patient > half in counseling coordination of care  Lab Results  Component Value Date   WBC 11.5* 01/04/2015   HGB 11.7* 01/04/2015   HCT 35.4* 01/04/2015   MCV 82.7 01/04/2015   PLT 347.0 01/04/2015   HT:VGVSY,VGCYOYOOJ, MD

## 2015-01-04 NOTE — Progress Notes (Signed)
Quick Note:  Slightly anemic, mild leukocytosis, platelets back to normal Recommend repeat CBC with ferritin and B12 level in one month via my chart. Orders placed. ______

## 2015-01-05 ENCOUNTER — Encounter: Payer: Self-pay | Admitting: Internal Medicine

## 2015-01-05 NOTE — Progress Notes (Signed)
Quick Note:  Sent her a My Chart note and placed lab orders Have not heard back from her  please call and advise her per last result note ______

## 2015-02-15 ENCOUNTER — Other Ambulatory Visit (INDEPENDENT_AMBULATORY_CARE_PROVIDER_SITE_OTHER): Payer: 59

## 2015-02-15 DIAGNOSIS — D649 Anemia, unspecified: Secondary | ICD-10-CM

## 2015-02-15 LAB — CBC WITH DIFFERENTIAL/PLATELET
BASOS ABS: 0 10*3/uL (ref 0.0–0.1)
Basophils Relative: 0.4 % (ref 0.0–3.0)
Eosinophils Absolute: 0.2 10*3/uL (ref 0.0–0.7)
Eosinophils Relative: 3 % (ref 0.0–5.0)
HEMATOCRIT: 37.5 % (ref 36.0–46.0)
Hemoglobin: 12.4 g/dL (ref 12.0–15.0)
Lymphocytes Relative: 41.7 % (ref 12.0–46.0)
Lymphs Abs: 3 10*3/uL (ref 0.7–4.0)
MCHC: 33.2 g/dL (ref 30.0–36.0)
MCV: 82.8 fl (ref 78.0–100.0)
MONO ABS: 0.7 10*3/uL (ref 0.1–1.0)
Monocytes Relative: 9.4 % (ref 3.0–12.0)
NEUTROS PCT: 45.5 % (ref 43.0–77.0)
Neutro Abs: 3.3 10*3/uL (ref 1.4–7.7)
Platelets: 396 10*3/uL (ref 150.0–400.0)
RBC: 4.53 Mil/uL (ref 3.87–5.11)
RDW: 15.9 % — AB (ref 11.5–15.5)
WBC: 7.3 10*3/uL (ref 4.0–10.5)

## 2015-02-15 LAB — FERRITIN: Ferritin: 7.1 ng/mL — ABNORMAL LOW (ref 10.0–291.0)

## 2015-02-15 LAB — VITAMIN B12: VITAMIN B 12: 411 pg/mL (ref 211–911)

## 2015-02-16 ENCOUNTER — Encounter: Payer: Self-pay | Admitting: Internal Medicine

## 2015-02-16 ENCOUNTER — Other Ambulatory Visit: Payer: Self-pay

## 2015-02-16 DIAGNOSIS — D509 Iron deficiency anemia, unspecified: Secondary | ICD-10-CM | POA: Insufficient documentation

## 2015-02-16 DIAGNOSIS — E611 Iron deficiency: Secondary | ICD-10-CM

## 2015-02-16 MED ORDER — IRON 325 (65 FE) MG PO TABS: 325.0000 mg | ORAL_TABLET | Freq: Two times a day (BID) | ORAL | Status: DC

## 2015-02-16 MED ORDER — IRON 325 (65 FE) MG PO TABS: 325.0000 mg | ORAL_TABLET | Freq: Every day | ORAL | Status: DC

## 2015-02-16 NOTE — Addendum Note (Signed)
Addended by: Marlon Pel on: 02/16/2015 02:07 PM   Modules accepted: Orders

## 2015-02-16 NOTE — Progress Notes (Signed)
Quick Note:  HgbNL but iron low so at risk for anemia B12 ok  Ferrous sulfate 325 mg bid CBC and ferritin in 3 months please ______

## 2015-06-01 ENCOUNTER — Other Ambulatory Visit (INDEPENDENT_AMBULATORY_CARE_PROVIDER_SITE_OTHER): Payer: 59

## 2015-06-01 DIAGNOSIS — E611 Iron deficiency: Secondary | ICD-10-CM

## 2015-06-01 LAB — CBC WITH DIFFERENTIAL/PLATELET
BASOS PCT: 1.3 % (ref 0.0–3.0)
Basophils Absolute: 0.1 10*3/uL (ref 0.0–0.1)
EOS ABS: 0.1 10*3/uL (ref 0.0–0.7)
Eosinophils Relative: 1.4 % (ref 0.0–5.0)
HEMATOCRIT: 38.6 % (ref 36.0–46.0)
HEMOGLOBIN: 12.9 g/dL (ref 12.0–15.0)
LYMPHS PCT: 30.6 % (ref 12.0–46.0)
Lymphs Abs: 2.5 10*3/uL (ref 0.7–4.0)
MCHC: 33.3 g/dL (ref 30.0–36.0)
MCV: 86.2 fl (ref 78.0–100.0)
MONO ABS: 0.7 10*3/uL (ref 0.1–1.0)
Monocytes Relative: 9.1 % (ref 3.0–12.0)
Neutro Abs: 4.7 10*3/uL (ref 1.4–7.7)
Neutrophils Relative %: 57.6 % (ref 43.0–77.0)
Platelets: 344 10*3/uL (ref 150.0–400.0)
RBC: 4.48 Mil/uL (ref 3.87–5.11)
RDW: 14.9 % (ref 11.5–15.5)
WBC: 8.2 10*3/uL (ref 4.0–10.5)

## 2015-06-01 LAB — FERRITIN: FERRITIN: 21.4 ng/mL (ref 10.0–291.0)

## 2015-06-04 NOTE — Progress Notes (Signed)
Quick Note:  Hgb NL Ferritin (iron) is just barely normal Stay on iron supplement See me in Spring 2017 - sooner prn ______

## 2015-07-01 ENCOUNTER — Encounter: Payer: Self-pay | Admitting: Internal Medicine

## 2016-10-17 ENCOUNTER — Encounter: Payer: Self-pay | Admitting: Gastroenterology

## 2016-10-23 ENCOUNTER — Ambulatory Visit (INDEPENDENT_AMBULATORY_CARE_PROVIDER_SITE_OTHER): Payer: Commercial Managed Care - HMO | Admitting: Internal Medicine

## 2016-10-23 ENCOUNTER — Other Ambulatory Visit (INDEPENDENT_AMBULATORY_CARE_PROVIDER_SITE_OTHER): Payer: Commercial Managed Care - HMO

## 2016-10-23 ENCOUNTER — Encounter: Payer: Self-pay | Admitting: Internal Medicine

## 2016-10-23 DIAGNOSIS — K5080 Crohn's disease of both small and large intestine without complications: Secondary | ICD-10-CM

## 2016-10-23 LAB — CBC WITH DIFFERENTIAL/PLATELET
BASOS ABS: 0.1 10*3/uL (ref 0.0–0.1)
Basophils Relative: 0.8 % (ref 0.0–3.0)
EOS ABS: 0.1 10*3/uL (ref 0.0–0.7)
Eosinophils Relative: 1.1 % (ref 0.0–5.0)
HCT: 38.8 % (ref 36.0–46.0)
Hemoglobin: 13 g/dL (ref 12.0–15.0)
LYMPHS ABS: 2.6 10*3/uL (ref 0.7–4.0)
LYMPHS PCT: 33 % (ref 12.0–46.0)
MCHC: 33.5 g/dL (ref 30.0–36.0)
MCV: 87.1 fl (ref 78.0–100.0)
MONOS PCT: 7.9 % (ref 3.0–12.0)
Monocytes Absolute: 0.6 10*3/uL (ref 0.1–1.0)
NEUTROS ABS: 4.5 10*3/uL (ref 1.4–7.7)
NEUTROS PCT: 57.2 % (ref 43.0–77.0)
PLATELETS: 320 10*3/uL (ref 150.0–400.0)
RBC: 4.45 Mil/uL (ref 3.87–5.11)
RDW: 14.3 % (ref 11.5–15.5)
WBC: 7.9 10*3/uL (ref 4.0–10.5)

## 2016-10-23 LAB — FERRITIN: Ferritin: 18.2 ng/mL (ref 10.0–291.0)

## 2016-10-23 NOTE — Patient Instructions (Signed)
   Your physician has requested that you go to the basement for the following lab work before leaving today: CBC/diff, Ferritin    Please follow up with Dr Carlean Purl in a year.     I appreciate the opportunity to care for you. Silvano Rusk, MD, [facility]

## 2016-10-23 NOTE — Progress Notes (Signed)
   Shannon Singh 36 y.o. 07/15/1981 [id number]  Assessment & Plan:   Crohn's ileocolitis Doing well on diet and probiotics/supplements She is aware things could worsen but does not want to take medication Do not think a routine colonoscopy needed - diagnostic prn CBC and ferritin today given hx of iron deficiency (does still menstruate) See me 1 year sooner prn  Cc;DEWEY,ELIZABETH, MD Dr. Marylynn Pearson  Subjective:   Chief Complaint: f/u Crohn's  HPI No problems - no sig diarrhea, abdominal pain or bleeding Still managing without disease specific meds using diet + probiotics Her twins are about to turn 7  Current Meds  Medication Sig  . levocetirizine (XYZAL) 2.5 MG/5ML solution Take 2.5 mg by mouth every evening. For allergies  . montelukast (SINGULAIR) 10 MG tablet Take 10 mg by mouth at bedtime.  . Probiotic Product (PROBIOTIC DAILY PO) Take 1 tablet by mouth daily. Pt takes, Probiotic of [place] brand  . sertraline (ZOLOFT) 25 MG tablet Take 25 mg by mouth daily.   No Known Allergies Past Medical History:  Diagnosis Date  . Asthma   . Chicken pox   . Crohn's ileocolitis ([place]) 06/03/2012  . Depression   . Heart murmur   . History of migraine headaches    Past Surgical History:  Procedure Laterality Date  . CESAREAN SECTION  12/2009   twins  . COLONOSCOPY W/ BIOPSIES  2008, 2014    Review of Systems Regular menses  Objective:   Physical Exam @BP  110/80   Pulse 68   Ht 5' 6"  (1.676 m)   Wt 175 lb 6.4 oz (79.6 kg)   LMP 10/07/2016 (Approximate)   BMI 28.31 kg/m @  General:  NAD Eyes:   anicteric Lungs:  clear Heart::  S1S2 no rubs, murmurs or gallops Abdomen:  soft and nontender, BS+   Data Reviewed:  Previous colonoscopies and bxs prior visits  15 minutes time spent with patient > half in counseling coordination of care

## 2016-10-23 NOTE — Assessment & Plan Note (Addendum)
Doing well on diet and probiotics/supplements She is aware things could worsen but does not want to take medication Do not think a routine colonoscopy needed - diagnostic prn CBC and ferritin today given hx of iron deficiency (does still menstruate)  See me 1 year sooner prn

## 2016-10-24 NOTE — Progress Notes (Signed)
My Chart note NL ferritin and CBC but low NL ferritin Iron supp reasonable

## 2017-06-20 DIAGNOSIS — J309 Allergic rhinitis, unspecified: Secondary | ICD-10-CM | POA: Diagnosis not present

## 2017-07-08 DIAGNOSIS — Z0000 Encounter for general adult medical examination without abnormal findings: Secondary | ICD-10-CM | POA: Diagnosis not present

## 2017-07-10 DIAGNOSIS — Z23 Encounter for immunization: Secondary | ICD-10-CM | POA: Diagnosis not present

## 2017-07-10 DIAGNOSIS — Z0000 Encounter for general adult medical examination without abnormal findings: Secondary | ICD-10-CM | POA: Diagnosis not present

## 2017-08-26 DIAGNOSIS — J3081 Allergic rhinitis due to animal (cat) (dog) hair and dander: Secondary | ICD-10-CM | POA: Diagnosis not present

## 2017-08-26 DIAGNOSIS — J301 Allergic rhinitis due to pollen: Secondary | ICD-10-CM | POA: Diagnosis not present

## 2017-08-26 DIAGNOSIS — L700 Acne vulgaris: Secondary | ICD-10-CM | POA: Diagnosis not present

## 2017-08-26 DIAGNOSIS — J3089 Other allergic rhinitis: Secondary | ICD-10-CM | POA: Diagnosis not present

## 2017-08-26 DIAGNOSIS — Z79899 Other long term (current) drug therapy: Secondary | ICD-10-CM | POA: Diagnosis not present

## 2017-10-21 DIAGNOSIS — Z79899 Other long term (current) drug therapy: Secondary | ICD-10-CM | POA: Diagnosis not present

## 2017-10-21 DIAGNOSIS — L700 Acne vulgaris: Secondary | ICD-10-CM | POA: Diagnosis not present

## 2017-10-21 DIAGNOSIS — L810 Postinflammatory hyperpigmentation: Secondary | ICD-10-CM | POA: Diagnosis not present

## 2017-11-07 ENCOUNTER — Ambulatory Visit: Payer: Commercial Managed Care - HMO | Admitting: Family Medicine

## 2017-11-08 DIAGNOSIS — J019 Acute sinusitis, unspecified: Secondary | ICD-10-CM | POA: Diagnosis not present

## 2017-11-28 ENCOUNTER — Other Ambulatory Visit: Payer: Self-pay

## 2017-11-28 ENCOUNTER — Ambulatory Visit: Payer: 59 | Admitting: Family Medicine

## 2017-11-28 ENCOUNTER — Encounter: Payer: Self-pay | Admitting: Family Medicine

## 2017-11-28 VITALS — BP 114/70 | HR 70 | Temp 98.2°F | Ht 66.0 in | Wt 181.6 lb

## 2017-11-28 DIAGNOSIS — L700 Acne vulgaris: Secondary | ICD-10-CM

## 2017-11-28 DIAGNOSIS — K5080 Crohn's disease of both small and large intestine without complications: Secondary | ICD-10-CM

## 2017-11-28 DIAGNOSIS — E663 Overweight: Secondary | ICD-10-CM | POA: Diagnosis not present

## 2017-11-28 DIAGNOSIS — Z23 Encounter for immunization: Secondary | ICD-10-CM

## 2017-11-28 DIAGNOSIS — F339 Major depressive disorder, recurrent, unspecified: Secondary | ICD-10-CM | POA: Insufficient documentation

## 2017-11-28 DIAGNOSIS — L709 Acne, unspecified: Secondary | ICD-10-CM | POA: Insufficient documentation

## 2017-11-28 NOTE — Progress Notes (Signed)
Subjective  CC:  Chief Complaint  Patient presents with  . Establish Care    Transfer from Dr. Ernie Hew    HPI: Shannon Singh is a 37 y.o. female who presents to [place] at [facility] today to establish care with me as a new patient. I saw her a few times at [facility] back in 2014; since has been seeing GYN and Dr. Ernie Hew. Last CPE 09/2016. Has a dermatologist and GI specialist She has the following concerns or needs:   Chrohn's - doing well. In remission. Seeing alternative specialist to manage holistically. Feels good.   Overweight: eats clean and regularly exercises.  Would like to lose weight.  No problems with energy.  Sleeps well.  She works full-time.  Mother of twins, 14 years old.  Chronic problems including depression, acne are well controlled.  Health maintenance is up-to-date.  She would qualify for the HPV vaccination series and would like to start that today.  We updated and reviewed the patient's past history in detail and it is documented below.  Patient Active Problem List   Diagnosis Date Noted  . Major depression, recurrent, chronic ([place]) 11/28/2017  . Acne 11/28/2017  . History of systemic steroid therapy 07/26/2013  . Crohn's ileocolitis ([place]) 06/03/2012  . Migraine headache 10/03/2007   Health Maintenance  Topic Date Due  . Samul Dada  01/05/2020  . PAP SMEAR  08/12/2020  . INFLUENZA VACCINE  Completed  . HIV Screening  Completed   Immunization History  Administered Date(s) Administered  . Influenza,trivalent, recombinat, inj, PF 07/27/2011  . Influenza-Unspecified 06/28/2013, 06/23/2017   No outpatient medications have been marked as taking for the 11/28/17 encounter (Office Visit) with Leamon Arnt, MD.   Allergies: Patient has No Known Allergies.  Past Medical History Patient  has a past medical history of Asthma, Chicken pox, Crohn's ileocolitis ([place]) (06/03/2012), Depression, Heart murmur, and History of migraine  headaches. Past Surgical History Patient  has a past surgical history that includes Colonoscopy w/ biopsies (2008, 2014) and Cesarean section (12/2009). Family History: Patient family history includes Colon polyps in her father; Healthy in her daughter and son; Heart disease in her maternal grandmother; Prostate cancer in her maternal grandfather. Social History:  Patient  reports that  has never smoked. she has never used smokeless tobacco. She reports that she drinks alcohol. She reports that she does not use drugs.  Review of Systems: Constitutional: negative for fever or malaise Cardiovascular: negative for chest pain Respiratory: negative for SOB or persistent cough Gastrointestinal: negative for abdominal pain Genitourinary: negative for dysuria or gross hematuria Musculoskeletal: negative for new gait disturbance or muscular weakness Integumentary: negative for new or persistent rashes  Patient Care Team    Relationship Specialty Notifications Start End  Leamon Arnt, MD PCP - General Family Medicine  11/28/17   Gatha Mayer, MD Consulting Physician Gastroenterology  11/28/17     Objective  Vitals: BP 114/70   Pulse 70   Temp 98.2 F (36.8 C)   Ht 5' 6"  (1.676 m)   Wt 181 lb 9.6 oz (82.4 kg)   LMP 10/24/2017   BMI 29.31 kg/m  General:  Well developed, well nourished, no acute distress  Psych:  Alert and oriented,normal mood and affect HEENT:  Normocephalic, atraumatic, supple neck  Cardiovascular:  RRR without murmur Respiratory:  Good breath sounds bilaterally, CTAB with normal respiratory effort Gastrointestinal: normal bowel sounds, soft, non-tender, no noted masses. No HSM Skin:  Warm, no rashes  Neurologic:   Mental status is normal. Gross motor and sensory exams are normal. Normal gait  Assessment  1. Crohn's disease of both small and large intestine without complication ([place])   2. Acne vulgaris   3. Overweight (BMI 25.0-29.9)   4. Need for HPV vaccination       Plan   Crohn's disease well controlled.  Acne on spironolactone.  Reportedly normal potassium at last CPE in December.  Counseling done regarding diet and weight loss.  HPV series started today, really.  Second dose in 6 months.  Next CPE in December.  Follow up: December 2019 for CPE.   Commons side effects, risks, benefits, and alternatives for medications and treatment plan prescribed today were discussed, and the patient expressed understanding of the given instructions. Patient is instructed to call or message via MyChart if he/she has any questions or concerns regarding our treatment plan. No barriers to understanding were identified. We discussed Red Flag symptoms and signs in detail. Patient expressed understanding regarding what to do in case of urgent or emergency type symptoms.   Medication list was reconciled, printed and provided to the patient in AVS. Patient instructions and summary information was reviewed with the patient as documented in the AVS. This note was prepared with assistance of Dragon voice recognition software. Occasional wrong-word or sound-a-like substitutions may have occurred due to the inherent limitations of voice recognition software  Orders Placed This Encounter  Procedures  . HPV 9-valent vaccine,Recombinat   No orders of the defined types were placed in this encounter.

## 2017-11-28 NOTE — Patient Instructions (Signed)
It was so good seeing you again! Thank you for establishing with my new practice and allowing me to continue caring for you. It means a lot to me.   Please schedule a follow up appointment with me in   [facility] nutrition app

## 2017-12-10 ENCOUNTER — Ambulatory Visit: Payer: 59

## 2017-12-15 ENCOUNTER — Ambulatory Visit: Payer: 59 | Admitting: Emergency Medicine

## 2017-12-15 DIAGNOSIS — Z23 Encounter for immunization: Secondary | ICD-10-CM

## 2017-12-15 MED ORDER — TUBERCULIN PPD 5 UNIT/0.1ML ID SOLN
5.0000 [IU] | Freq: Once | INTRADERMAL | Status: AC
  Administered 2017-12-15 (×2): 5 [IU] via INTRADERMAL

## 2017-12-17 ENCOUNTER — Ambulatory Visit: Payer: 59

## 2017-12-17 ENCOUNTER — Encounter: Payer: Self-pay | Admitting: *Deleted

## 2017-12-17 DIAGNOSIS — Z23 Encounter for immunization: Secondary | ICD-10-CM | POA: Diagnosis not present

## 2017-12-17 LAB — TB SKIN TEST
INDURATION: 0 mm
TB SKIN TEST: NEGATIVE

## 2017-12-17 NOTE — Addendum Note (Signed)
Addended by: Katina Dung on: 12/17/2017 01:10 PM   Modules accepted: Orders

## 2017-12-18 NOTE — Progress Notes (Signed)
Please call patient: I have reviewed his/her lab results. Negative TB test

## 2018-01-22 ENCOUNTER — Telehealth: Payer: Self-pay | Admitting: Family Medicine

## 2018-01-22 NOTE — Telephone Encounter (Signed)
Copied from [place] [id number]. Topic: Quick Communication - See Telephone Encounter >> Jan 22, 2018 10:51 AM Robina Ade, Helene Kelp D wrote: CRM for notification. See Telephone encounter for: 01/22/18. Patient called and would like to add another vaccine for tomorrow visit and would like to talk to someone about this. Please call patient back, thanks.

## 2018-01-22 NOTE — Telephone Encounter (Signed)
Patient is requesting Measles vaccine, with the recent measles outbreak. Patient wanted to do a titer to see if she has immunity to it. Ok to order?  Please advise.   Doloris Hall,  LPN

## 2018-01-23 NOTE — Telephone Encounter (Signed)
Spoke with patient regarding MMR titer, she will check with  Insurance company to let us know if they will cover.   Patient verbalized understanding.    Doloris Hall,  LPN

## 2018-01-23 NOTE — Telephone Encounter (Signed)
At this time, there is no recommendation for broad screening to see if those who have been immunized against measles should get tested. Most of the outbreak is due to unimmunized people.   Because she has a chronic illness and has been on steroids, I am Ok with ordering the test, however, I am not certain that insurance will cover this test.  She may call to verify or sign a waiver.   Most people, however, do not need to get tested at this time.

## 2018-01-30 ENCOUNTER — Ambulatory Visit: Payer: 59

## 2018-01-30 ENCOUNTER — Ambulatory Visit (INDEPENDENT_AMBULATORY_CARE_PROVIDER_SITE_OTHER): Payer: 59

## 2018-01-30 DIAGNOSIS — Z23 Encounter for immunization: Secondary | ICD-10-CM

## 2018-01-30 NOTE — Progress Notes (Signed)
Gave patient 2nd Gardasil injection in right deltoid and patient tolerated well.  Advised patient to make appt in 4 months for 3rd Gardasil injection and is scheduled for 06/01/2018.

## 2018-02-25 DIAGNOSIS — Z809 Family history of malignant neoplasm, unspecified: Secondary | ICD-10-CM | POA: Diagnosis not present

## 2018-03-30 ENCOUNTER — Ambulatory Visit: Payer: 59

## 2018-04-20 DIAGNOSIS — D485 Neoplasm of uncertain behavior of skin: Secondary | ICD-10-CM | POA: Diagnosis not present

## 2018-04-20 DIAGNOSIS — D225 Melanocytic nevi of trunk: Secondary | ICD-10-CM | POA: Diagnosis not present

## 2018-04-20 DIAGNOSIS — S01332A Puncture wound without foreign body of left ear, initial encounter: Secondary | ICD-10-CM | POA: Diagnosis not present

## 2018-05-29 ENCOUNTER — Telehealth: Payer: Self-pay | Admitting: Family Medicine

## 2018-05-29 NOTE — Telephone Encounter (Signed)
Copied from [place] [id number]. Topic: Quick Communication - See Telephone Encounter >> May 29, 2018 12:36 PM Blase Mess A wrote: CRM for notification. See Telephone encounter for: 05/29/18. Patient is calling because is had documentation that she had a TB test done and read.  She has misplaced the documentation and would like emailed or she can stop by and pick it up.  Patients email address is [email or web address] and call back number is [phone]

## 2018-05-29 NOTE — Telephone Encounter (Signed)
Copy of Patient TB skin test is up front. Patient informed and verbalized understanding    Doloris Hall,  LPN

## 2018-06-01 ENCOUNTER — Ambulatory Visit: Payer: 59

## 2018-06-01 ENCOUNTER — Ambulatory Visit (INDEPENDENT_AMBULATORY_CARE_PROVIDER_SITE_OTHER): Payer: 59

## 2018-06-01 DIAGNOSIS — Z23 Encounter for immunization: Secondary | ICD-10-CM

## 2018-06-01 NOTE — Progress Notes (Signed)
Gave patient her 3rd Gardasil 9 vaccination in her left deltoid today. Patient tolerated well.

## 2018-06-03 ENCOUNTER — Other Ambulatory Visit (INDEPENDENT_AMBULATORY_CARE_PROVIDER_SITE_OTHER): Payer: 59

## 2018-06-03 ENCOUNTER — Ambulatory Visit: Payer: 59 | Admitting: Nurse Practitioner

## 2018-06-03 ENCOUNTER — Encounter: Payer: Self-pay | Admitting: Nurse Practitioner

## 2018-06-03 VITALS — BP 118/60 | HR 68 | Ht 66.0 in | Wt 177.0 lb

## 2018-06-03 DIAGNOSIS — R197 Diarrhea, unspecified: Secondary | ICD-10-CM

## 2018-06-03 DIAGNOSIS — K50919 Crohn's disease, unspecified, with unspecified complications: Secondary | ICD-10-CM

## 2018-06-03 LAB — CBC WITH DIFFERENTIAL/PLATELET
BASOS ABS: 0.1 10*3/uL (ref 0.0–0.1)
Basophils Relative: 0.8 % (ref 0.0–3.0)
EOS ABS: 0.6 10*3/uL (ref 0.0–0.7)
Eosinophils Relative: 6.3 % — ABNORMAL HIGH (ref 0.0–5.0)
HCT: 37.8 % (ref 36.0–46.0)
Hemoglobin: 12.5 g/dL (ref 12.0–15.0)
LYMPHS ABS: 2.6 10*3/uL (ref 0.7–4.0)
LYMPHS PCT: 28.1 % (ref 12.0–46.0)
MCHC: 33.2 g/dL (ref 30.0–36.0)
MCV: 87.1 fl (ref 78.0–100.0)
MONOS PCT: 9.5 % (ref 3.0–12.0)
Monocytes Absolute: 0.9 10*3/uL (ref 0.1–1.0)
NEUTROS ABS: 5 10*3/uL (ref 1.4–7.7)
NEUTROS PCT: 55.3 % (ref 43.0–77.0)
PLATELETS: 388 10*3/uL (ref 150.0–400.0)
RBC: 4.34 Mil/uL (ref 3.87–5.11)
RDW: 14.4 % (ref 11.5–15.5)
WBC: 9.1 10*3/uL (ref 4.0–10.5)

## 2018-06-03 LAB — COMPREHENSIVE METABOLIC PANEL
ALK PHOS: 81 U/L (ref 39–117)
ALT: 19 U/L (ref 0–35)
AST: 16 U/L (ref 0–37)
Albumin: 4 g/dL (ref 3.5–5.2)
BILIRUBIN TOTAL: 0.4 mg/dL (ref 0.2–1.2)
BUN: 13 mg/dL (ref 6–23)
CO2: 25 meq/L (ref 19–32)
CREATININE: 0.81 mg/dL (ref 0.40–1.20)
Calcium: 9.4 mg/dL (ref 8.4–10.5)
Chloride: 102 mEq/L (ref 96–112)
GFR: 83.92 mL/min (ref >60.00)
GLUCOSE: 87 mg/dL (ref 70–99)
Potassium: 4.2 mEq/L (ref 3.5–5.1)
SODIUM: 135 meq/L (ref 135–145)
TOTAL PROTEIN: 7.3 g/dL (ref 6.0–8.3)

## 2018-06-03 LAB — HIGH SENSITIVITY CRP: CRP, High Sensitivity: 24.63 mg/L — ABNORMAL HIGH (ref 0.000–5.000)

## 2018-06-03 LAB — SEDIMENTATION RATE: Sed Rate: 28 mm/hr — ABNORMAL HIGH (ref 0–20)

## 2018-06-03 NOTE — Patient Instructions (Signed)
If you are age 38 or older, your body mass index should be between 23-30. Your Body mass index is 28.57 kg/m. If this is out of the aforementioned range listed, please consider follow up with your Primary Care Provider.  If you are age 22 or younger, your body mass index should be between 19-25. Your Body mass index is 28.57 kg/m. If this is out of the aformentioned range listed, please consider follow up with your Primary Care Provider.   Your provider has requested that you go to the basement level for lab work before leaving today. Press "B" on the elevator. The lab is located at the first door on the left as you exit the elevator.  Will call you with lab results.  Thank you for choosing me and [facility].   Tye Savoy, NP

## 2018-06-03 NOTE — Progress Notes (Signed)
Primary GI:   Silvano Rusk, MD   Chief Complaint:    Crohn's flare  IMPRESSION and PLAN:    #19.  37 year old female with long-standing Crohn's disease (ileocolitis?)  Good response to Lialda a few years ago but after losing response she was not interested in Biologics.  Treating Crohn's with probiotics and diet, apparently with good success, for the last couple of years.  Now with flare symptoms since mid July.  -check stool for C-diff. -no recent labs. Check CMET, CBC, ESR, CRP - If negative then consider short course of prednisone.  -If C-diff negative will arrange for colonoscopy exam for reassessment of disease. The risks and benefits of colonoscopy with possible polypectomy were discussed and the patient agrees to proceed.       HPI:     Patient is a 37 year old female with Crohn's disease (? Ileocolitis) diagnosed around 2008.  She was previously treated with Lialda which worked nicely for a while and even put her in endoscopic remission based on last colonoscopy in 2014.  The medication apparently stopped working, we talked with her about Biologics but she was opposed.  Over the last few years she has been treating her Crohn's disease with a biotics and diet.  She has apparently done well until mid July.  Since then she has been having diarrhea with blood and mucus and increased gas.  She is having numerous bowel movements a day, nocturnal stooling as well.  No recent antibiotic use.  She has no associated fevers or joint aches.  No nausea or vomiting.  No significant abdominal pain, her abdomen just feels diffusely uncomfortable and achy. Her weight is stable.   Review of systems:     No chest pain, no SOB, no fevers, no urinary sx   Past Medical History:  Diagnosis Date  . Asthma   . Chicken pox   . Crohn's ileocolitis ([place]) 06/03/2012  . Depression   . Heart murmur   . History of migraine headaches     Patient's surgical history, family medical history, social history,  medications and allergies were all reviewed in Epic   Creatinine clearance cannot be calculated (Patient's most recent lab result is older than the maximum 21 days allowed.)  Current Outpatient Medications  Medication Sig Dispense Refill  . levocetirizine (XYZAL) 2.5 MG/5ML solution Take 2.5 mg by mouth every evening. For allergies    . montelukast (SINGULAIR) 10 MG tablet Take 10 mg by mouth at bedtime.    . norethindrone-ethinyl estradiol (JUNEL FE,GILDESS FE,LOESTRIN FE) 1-20 MG-MCG tablet Take 1 tablet by mouth daily.    . Probiotic Product (PROBIOTIC DAILY PO) Take 1 tablet by mouth daily. Pt takes, Probiotic of [place] brand    . sertraline (ZOLOFT) 50 MG tablet Take 50 mg by mouth daily.    Marland Kitchen spironolactone (ALDACTONE) 50 MG tablet TK 1 T PO D WF  0   No current facility-administered medications for this visit.     Physical Exam:     BP 118/60   Pulse 68   Ht 5' 6"  (1.676 m)   Wt 177 lb (80.3 kg)   BMI 28.57 kg/m   GENERAL:  Pleasant female in NAD PSYCH: : Cooperative, normal affect EENT:  conjunctiva pink, mucous membranes moist, neck supple without masses CARDIAC:  RRR, no murmur heard, no peripheral edema PULM: Normal respiratory effort, lungs CTA bilaterally, no wheezing ABDOMEN:  Nondistended, soft, nontender. No obvious masses, no hepatomegaly,  normal bowel sounds SKIN:  turgor, no lesions seen Musculoskeletal:  Normal muscle tone, normal strength NEURO: Alert and oriented x 3, no focal neurologic deficits   Tye Savoy , NP 06/03/2018, 8:56 AM

## 2018-06-04 ENCOUNTER — Other Ambulatory Visit: Payer: 59

## 2018-06-04 DIAGNOSIS — R197 Diarrhea, unspecified: Secondary | ICD-10-CM

## 2018-06-04 DIAGNOSIS — K50919 Crohn's disease, unspecified, with unspecified complications: Secondary | ICD-10-CM

## 2018-06-05 LAB — CLOSTRIDIUM DIFFICILE TOXIN B, QUALITATIVE, REAL-TIME PCR: CDIFFPCR: NOT DETECTED

## 2018-06-08 ENCOUNTER — Other Ambulatory Visit: Payer: Self-pay

## 2018-06-08 ENCOUNTER — Telehealth: Payer: Self-pay | Admitting: Nurse Practitioner

## 2018-06-08 DIAGNOSIS — K50919 Crohn's disease, unspecified, with unspecified complications: Secondary | ICD-10-CM

## 2018-06-08 MED ORDER — PREDNISONE 10 MG PO TABS: ORAL_TABLET | ORAL | 0 refills | Status: DC

## 2018-06-09 NOTE — Progress Notes (Signed)
OK - agree

## 2018-06-09 NOTE — Progress Notes (Signed)
Beth,  Tell Benedetta I do not think we need to do a colonoscopy unless she does not respond to prednisone.  Have her update me in 1 week by phone or My Chart (she may have to go through [facility]) and we will figure out a taper and longer-term plan.  Going to need to get her on the books to see me in November but we will communicate between now and then and see her sooner if there are issues that need attention.  CEG

## 2018-06-10 ENCOUNTER — Ambulatory Visit (AMBULATORY_SURGERY_CENTER): Payer: Self-pay | Admitting: *Deleted

## 2018-06-10 ENCOUNTER — Encounter: Payer: Self-pay | Admitting: Internal Medicine

## 2018-06-10 VITALS — Ht 66.0 in | Wt 178.0 lb

## 2018-06-10 DIAGNOSIS — K50919 Crohn's disease, unspecified, with unspecified complications: Secondary | ICD-10-CM

## 2018-06-10 NOTE — Progress Notes (Signed)
No egg or soy allergy known to patient  No issues with past sedation with any surgeries  or procedures, no intubation problems  No diet pills per patient No home 02 use per patient  No blood thinners per patient  Pt denies issues with constipation  No A fib or A flutter  EMMI video sent to pt's e mail -- pt declined

## 2018-06-10 NOTE — Telephone Encounter (Signed)
Already being taken care of

## 2018-06-14 ENCOUNTER — Other Ambulatory Visit: Payer: Self-pay | Admitting: Family Medicine

## 2018-06-15 ENCOUNTER — Telehealth: Payer: Self-pay

## 2018-06-15 NOTE — Telephone Encounter (Signed)
long-standing Crohn's disease (ileocolitis?)  Patient calls with an update of her symptoms since starting the prednisone. She is having "5 or so " bowel movements daily. Still have blood and mucus with the bowel movements. Sometimes that is all she passes. Some spells of tenesmus. States not as much urgency. That is the one improvement.

## 2018-06-15 NOTE — Telephone Encounter (Signed)
Received and reviewed medication refill request.  Request is appropriate and was approved.  Please see medication orders for details.

## 2018-06-16 NOTE — Telephone Encounter (Signed)
I suggest she remain on 40 mg prednisone daily until colonoscopy

## 2018-06-16 NOTE — Telephone Encounter (Signed)
Spoke with the patient today. She has passed "strange things" that look like they are covered in mucous. Everything else is the same.

## 2018-06-16 NOTE — Telephone Encounter (Signed)
Advised 

## 2018-06-25 ENCOUNTER — Ambulatory Visit (AMBULATORY_SURGERY_CENTER): Payer: 59 | Admitting: Internal Medicine

## 2018-06-25 ENCOUNTER — Encounter: Payer: Self-pay | Admitting: Internal Medicine

## 2018-06-25 VITALS — BP 118/71 | HR 52 | Temp 98.7°F | Resp 12 | Ht 66.0 in | Wt 178.0 lb

## 2018-06-25 DIAGNOSIS — K529 Noninfective gastroenteritis and colitis, unspecified: Secondary | ICD-10-CM

## 2018-06-25 DIAGNOSIS — K50919 Crohn's disease, unspecified, with unspecified complications: Secondary | ICD-10-CM | POA: Diagnosis not present

## 2018-06-25 DIAGNOSIS — K5090 Crohn's disease, unspecified, without complications: Secondary | ICD-10-CM | POA: Diagnosis not present

## 2018-06-25 MED ORDER — MESALAMINE 1.2 G PO TBEC: 2.4000 g | DELAYED_RELEASE_TABLET | Freq: Two times a day (BID) | ORAL | 3 refills | Status: DC

## 2018-06-25 MED ORDER — SODIUM CHLORIDE 0.9 % IV SOLN: 500.0000 mL | Freq: Once | INTRAVENOUS | Status: DC

## 2018-06-25 NOTE — Patient Instructions (Addendum)
There is inflammation in the rectum + sigmoid colon, around the appendix. Otherwise looks ok. I took biopsies and will call results.  I am restarting Lialda 2.4 g twice a day.  Stay on 30 mg prednisone for another week. I will get an update when we call the results.  Go ahead and call and make a November appointment with me.  I appreciate the opportunity to care for you. Gatha Mayer, MD, FACG  YOU HAD AN ENDOSCOPIC PROCEDURE TODAY AT [facility]:   Refer to the procedure report that was given to you for any specific questions about what was found during the examination.  If the procedure report does not answer your questions, please call your gastroenterologist to clarify.  If you requested that your care partner not be given the details of your procedure findings, then the procedure report has been included in a sealed envelope for you to review at your convenience later.  YOU SHOULD EXPECT: Some feelings of bloating in the abdomen. Passage of more gas than usual.  Walking can help get rid of the air that was put into your GI tract during the procedure and reduce the bloating. If you had a lower endoscopy (such as a colonoscopy or flexible sigmoidoscopy) you may notice spotting of blood in your stool or on the toilet paper. If you underwent a bowel prep for your procedure, you may not have a normal bowel movement for a few days.  Please Note:  You might notice some irritation and congestion in your nose or some drainage.  This is from the oxygen used during your procedure.  There is no need for concern and it should clear up in a day or so.  SYMPTOMS TO REPORT IMMEDIATELY:   Following lower endoscopy (colonoscopy or flexible sigmoidoscopy):  Excessive amounts of blood in the stool  Significant tenderness or worsening of abdominal pains  Swelling of the abdomen that is new, acute  Fever of 100F or higher  For urgent or emergent issues, a gastroenterologist  can be reached at any hour by calling [phone].   DIET:  We do recommend a small meal at first, but then you may proceed to your regular diet.  Drink plenty of fluids but you should avoid alcoholic beverages for 24 hours.  ACTIVITY:  You should plan to take it easy for the rest of today and you should NOT DRIVE or use heavy machinery until tomorrow (because of the sedation medicines used during the test).    FOLLOW UP: Our staff will call the number listed on your records the next business day following your procedure to check on you and address any questions or concerns that you may have regarding the information given to you following your procedure. If we do not reach you, we will leave a message.  However, if you are feeling well and you are not experiencing any problems, there is no need to return our call.  We will assume that you have returned to your regular daily activities without incident.  If any biopsies were taken you will be contacted by phone or by letter within the next 1-3 weeks.  Please call us at [phone] if you have not heard about the biopsies in 3 weeks.    SIGNATURES/CONFIDENTIALITY: You and/or your care partner have signed paperwork which will be entered into your electronic medical record.  These signatures attest to the fact that that the information above on your After Visit Summary  has been reviewed and is understood.  Full responsibility of the confidentiality of this discharge information lies with you and/or your care-partner.

## 2018-06-25 NOTE — Progress Notes (Signed)
Pt's states no medical or surgical changes since previsit or office visit. 

## 2018-06-25 NOTE — Progress Notes (Signed)
Alert and oriented x 3, pleased with MAC, report to RN

## 2018-06-25 NOTE — Op Note (Signed)
[place] Patient Name: Shannon Singh Procedure Date: 06/25/2018 10:56 AM MRN: [id number] Endoscopist: Gatha Mayer , MD Age: 37 Referring MD:  Date of Birth: June 10, 1981 Gender: Female Account #: [id number] Procedure:                Colonoscopy Indications:              Crohn's disease of the small bowel and colon,                            Follow-up of Crohn's disease of the small bowel and                            colon, Disease activity assessment of Crohn's                            disease of the small bowel and colon Medicines:                Propofol per Anesthesia, Monitored Anesthesia Care Procedure:                Pre-Anesthesia Assessment:                           - Prior to the procedure, a History and Physical                            was performed, and patient medications and                            allergies were reviewed. The patient's tolerance of                            previous anesthesia was also reviewed. The risks                            and benefits of the procedure and the sedation                            options and risks were discussed with the patient.                            All questions were answered, and informed consent                            was obtained. Prior Anticoagulants: The patient has                            taken no previous anticoagulant or antiplatelet                            agents. ASA Grade Assessment: II - A patient with                            mild systemic disease. After reviewing the risks  and benefits, the patient was deemed in                            satisfactory condition to undergo the procedure.                           After obtaining informed consent, the colonoscope                            was passed under direct vision. Throughout the                            procedure, the patient's blood pressure, pulse, and                            oxygen  saturations were monitored continuously. The                            Model PCF-[id number]) scope was introduced                            through the anus and advanced to the the terminal                            ileum, with identification of the appendiceal                            orifice and IC valve. The colonoscopy was performed                            without difficulty. The patient tolerated the                            procedure well. The quality of the bowel                            preparation was excellent. The terminal ileum,                            ileocecal valve, appendiceal orifice, and rectum                            were photographed. Scope In: 11:14:50 AM Scope Out: 11:27:23 AM Scope Withdrawal Time: 0 hours 9 minutes 6 seconds  Total Procedure Duration: 0 hours 12 minutes 33 seconds  Findings:                 The perianal and digital rectal examinations were                            normal.                           Inflammation characterized by granularity,  confluent ulcerations and shallow ulcerations was                            found in a continuous and circumferential pattern                            from the anus to the distal sigmoid colon and in                            the cecum. The proximal sigmoid colon, the mid                            sigmoid colon, the descending colon, the transverse                            colon and the ascending colon were spared. This was                            moderate in severity. Biopsies were taken with a                            cold forceps for histology. Verification of patient                            identification for the specimen was done. Estimated                            blood loss was minimal.                           The terminal ileum appeared normal. Biopsies were                            taken with a cold forceps for histology.                             Verification of patient identification for the                            specimen was done. Estimated blood loss was minimal.                           The exam was otherwise without abnormality on                            direct and retroflexion views. Complications:            No immediate complications. Estimated Blood Loss:     Estimated blood loss was minimal. Impression:               - Inflammatory bowel disease. Inflammation was                            found from the anus to the distal sigmoid colon  and                            in the cecum (appendiceal patch). This was moderate                            in severity. Biopsied.                           - The examined portion of the ileum was normal.                            Biopsied.                           - The examination was otherwise normal on direct                            and retroflexion views. Recommendation:           - Patient has a contact number available for                            emergencies. The signs and symptoms of potential                            delayed complications were discussed with the                            patient. Return to normal activities tomorrow.                            Written discharge instructions were provided to the                            patient.                           - Resume previous diet.                           - Continue present medications.                           - Await pathology results.                           - I have rxed Lialda generic 2.4 g bid                           Sghe is to stay on 30 mg prednisone until I call in                            her in about 1 week with results                           She should call  and make a November (late)                            appointment to see me Gatha Mayer, MD 06/25/2018 11:39:49 AM This report has been signed electronically.

## 2018-06-25 NOTE — Progress Notes (Signed)
Called to room to assist during endoscopic procedure.  Patient ID and intended procedure confirmed with present staff. Received instructions for my participation in the procedure from the performing physician.  

## 2018-06-26 ENCOUNTER — Telehealth: Payer: Self-pay

## 2018-06-26 NOTE — Telephone Encounter (Signed)
  Follow up Call-  Call back number 06/25/2018  Post procedure Call Back phone  # [phone]  Permission to leave phone message Yes  Some recent data might be hidden     Left message

## 2018-06-26 NOTE — Telephone Encounter (Signed)
  Follow up Call-  Call back number 06/25/2018  Post procedure Call Back phone  # [phone]  Permission to leave phone message Yes  Some recent data might be hidden     Patient questions:  Do you have a fever, pain , or abdominal swelling? No. Pain Score  0 *  Have you tolerated food without any problems? Yes.    Have you been able to return to your normal activities? Yes.    Do you have any questions about your discharge instructions: Diet   No. Medications  No. Follow up visit  No.  Do you have questions or concerns about your Care? No.  Actions: * If pain score is 4 or above: No action needed, pain <4.

## 2018-06-29 DIAGNOSIS — Z23 Encounter for immunization: Secondary | ICD-10-CM | POA: Diagnosis not present

## 2018-07-03 ENCOUNTER — Other Ambulatory Visit: Payer: Self-pay | Admitting: Nurse Practitioner

## 2018-07-07 ENCOUNTER — Telehealth: Payer: Self-pay | Admitting: Internal Medicine

## 2018-07-07 MED ORDER — PREDNISONE 10 MG PO TABS: ORAL_TABLET | ORAL | 0 refills | Status: DC

## 2018-07-07 NOTE — Telephone Encounter (Signed)
MESSAGE LEFT RE: REFILLED PREDNISONE NEED HER TO SEND AN UPDATE BY MY CHART OR PHONE RE: SXS SO THAT I MAY DEFINE TAPER

## 2018-07-07 NOTE — Telephone Encounter (Signed)
I spoke to her She will stay on 30 mg prednisone and if not getting better w/ nocturnal sxs by 5 days from now will message me and we can Rx [place] suppositories vs hydrocortisone suppositories

## 2018-07-10 NOTE — Progress Notes (Signed)
Results have been reviewed by phone Colonoscopy recall 06/2020

## 2018-07-21 ENCOUNTER — Encounter: Payer: 59 | Admitting: Internal Medicine

## 2018-08-05 ENCOUNTER — Encounter: Payer: Self-pay | Admitting: Internal Medicine

## 2018-08-05 ENCOUNTER — Ambulatory Visit: Payer: 59 | Admitting: Internal Medicine

## 2018-08-05 DIAGNOSIS — K5080 Crohn's disease of both small and large intestine without complications: Secondary | ICD-10-CM

## 2018-08-05 NOTE — Patient Instructions (Signed)
Please follow up with Dr Carlean Purl in 6 months.   Take 10 mg of your Prednisone x 2 weeks and then go to 5 mg for 2 weeks and then stop it.   I appreciate the opportunity to care for you. Silvano Rusk, MD, [facility]

## 2018-08-05 NOTE — Assessment & Plan Note (Addendum)
Doing well  Taper prednisone 10 5 Stop  Stay on Lialda RTC 6 mos

## 2018-08-09 NOTE — Progress Notes (Signed)
   Shannon Singh 37 y.o. 02/25/1981 [id number]  Assessment & Plan:   Crohn's ileocolitis ([place]) Doing well  Taper prednisone 10 5 Stop  Stay on Lialda RTC 6 mos     Subjective:   Chief Complaint: Follow-up of Crohn's ileocolitis   HPI Shannon Singh is here for follow-up, recent colonoscopy proved that she was having a flare of her Crohn's ileocolitis.  She was treated with prednisone and restarted on Lialda 4.8 g total daily.  She feels like she is doing very well at this point and wants to come down off the prednisone and taper off.  She says she did not understand how bad she was until she got on treatment.  She had gone for a number of years without therapy trying to control things through diet. No Known Allergies Current Meds  Medication Sig  . levocetirizine (XYZAL) 2.5 MG/5ML solution Take 2.5 mg by mouth every evening. For allergies  . mesalamine (LIALDA) 1.2 g EC tablet Take 2 tablets (2.4 g total) by mouth 2 (two) times daily with a meal.  . montelukast (SINGULAIR) 10 MG tablet Take 10 mg by mouth at bedtime.  . norethindrone-ethinyl estradiol (JUNEL FE,GILDESS FE,LOESTRIN FE) 1-20 MG-MCG tablet Take 1 tablet by mouth daily.  . predniSONE (DELTASONE) 10 MG tablet TAKE 3 TABLETS BY MOUTH DAILY AS DIRECTED TAPER INSTRUCTIONS TO FOLLOW (Patient taking differently: Take 20 mg by mouth daily. TAKE 3 TABLETS BY MOUTH DAILY AS DIRECTED TAPER INSTRUCTIONS TO FOLLOW)  . Probiotic Product (PROBIOTIC DAILY PO) Take 1 tablet by mouth daily. Pt takes, Probiotic of [place] brand  . sertraline (ZOLOFT) 50 MG tablet TAKE 1 TABLET BY MOUTH EVERY DAY  . spironolactone (ALDACTONE) 50 MG tablet TK 1 T PO D WF   Past Medical History:  Diagnosis Date  . Allergy   . Anemia    borderline  . Asthma    exrecised induced  . Chicken pox   . Crohn's ileocolitis ([place]) 06/03/2012  . Depression   . Heart murmur    mild murmur  . History of migraine headaches    Past Surgical History:  Procedure  Laterality Date  . CESAREAN SECTION  12/2009   twins  . COLONOSCOPY    . COLONOSCOPY W/ BIOPSIES  2008, 2014  . MOUTH SURGERY     7th- 8th grade    Social History   Social History Narrative   Married, and children born 2011, 1 son 1 daughter - twins   Employed in [employer]   family history includes Colon polyps in her father; Healthy in her daughter and son; Heart disease in her maternal grandmother; Prostate cancer in her maternal grandfather.   Review of Systems As per HPI  Objective:   Physical Exam BP 90/68   Pulse 72   Ht 5' 6"  (1.676 m)   Wt 173 lb (78.5 kg)   LMP 07/28/2018 (Approximate)   BMI 27.92 kg/m  No acute distress

## 2018-08-26 DIAGNOSIS — J3081 Allergic rhinitis due to animal (cat) (dog) hair and dander: Secondary | ICD-10-CM | POA: Diagnosis not present

## 2018-08-26 DIAGNOSIS — J301 Allergic rhinitis due to pollen: Secondary | ICD-10-CM | POA: Diagnosis not present

## 2018-08-26 DIAGNOSIS — J3089 Other allergic rhinitis: Secondary | ICD-10-CM | POA: Diagnosis not present

## 2018-08-28 ENCOUNTER — Encounter: Payer: Self-pay | Admitting: Family Medicine

## 2018-08-28 ENCOUNTER — Ambulatory Visit: Payer: 59 | Admitting: Family Medicine

## 2018-08-28 VITALS — BP 106/90 | HR 68 | Temp 97.9°F | Ht 66.0 in | Wt 177.2 lb

## 2018-08-28 DIAGNOSIS — F339 Major depressive disorder, recurrent, unspecified: Secondary | ICD-10-CM

## 2018-08-28 DIAGNOSIS — L700 Acne vulgaris: Secondary | ICD-10-CM | POA: Diagnosis not present

## 2018-08-28 DIAGNOSIS — Z0000 Encounter for general adult medical examination without abnormal findings: Secondary | ICD-10-CM

## 2018-08-28 DIAGNOSIS — Z3041 Encounter for surveillance of contraceptive pills: Secondary | ICD-10-CM | POA: Insufficient documentation

## 2018-08-28 DIAGNOSIS — K5080 Crohn's disease of both small and large intestine without complications: Secondary | ICD-10-CM

## 2018-08-28 LAB — COMPREHENSIVE METABOLIC PANEL
ALBUMIN: 4.4 g/dL (ref 3.5–5.2)
ALK PHOS: 74 U/L (ref 39–117)
ALT: 16 U/L (ref 0–35)
AST: 17 U/L (ref 0–37)
BUN: 16 mg/dL (ref 6–23)
CO2: 26 mEq/L (ref 19–32)
Calcium: 9.5 mg/dL (ref 8.4–10.5)
Chloride: 104 mEq/L (ref 96–112)
Creatinine, Ser: 0.79 mg/dL (ref 0.40–1.20)
GFR: 86.27 mL/min (ref >60.00)
Glucose, Bld: 86 mg/dL (ref 70–99)
POTASSIUM: 4.4 meq/L (ref 3.5–5.1)
Sodium: 137 mEq/L (ref 135–145)
TOTAL PROTEIN: 7.6 g/dL (ref 6.0–8.3)
Total Bilirubin: 0.4 mg/dL (ref 0.2–1.2)

## 2018-08-28 LAB — LIPID PANEL
Cholesterol: 233 mg/dL — ABNORMAL HIGH (ref 0–200)
HDL: 51.8 mg/dL (ref >39.00)
NonHDL: 181.06
Total CHOL/HDL Ratio: 4
Triglycerides: 208 mg/dL — ABNORMAL HIGH (ref 0.0–149.0)
VLDL: 41.6 mg/dL — AB (ref 0.0–40.0)

## 2018-08-28 LAB — CBC WITH DIFFERENTIAL/PLATELET
Basophils Absolute: 0.1 10*3/uL (ref 0.0–0.1)
Basophils Relative: 0.8 % (ref 0.0–3.0)
EOS PCT: 1.3 % (ref 0.0–5.0)
Eosinophils Absolute: 0.1 10*3/uL (ref 0.0–0.7)
HCT: 39.7 % (ref 36.0–46.0)
HEMOGLOBIN: 13.1 g/dL (ref 12.0–15.0)
Lymphocytes Relative: 27.2 % (ref 12.0–46.0)
Lymphs Abs: 2.5 10*3/uL (ref 0.7–4.0)
MCHC: 33 g/dL (ref 30.0–36.0)
MCV: 87 fl (ref 78.0–100.0)
MONOS PCT: 7.4 % (ref 3.0–12.0)
Monocytes Absolute: 0.7 10*3/uL (ref 0.1–1.0)
Neutro Abs: 5.9 10*3/uL (ref 1.4–7.7)
Neutrophils Relative %: 63.3 % (ref 43.0–77.0)
Platelets: 370 10*3/uL (ref 150.0–400.0)
RBC: 4.56 Mil/uL (ref 3.87–5.11)
RDW: 14.4 % (ref 11.5–15.5)
WBC: 9.3 10*3/uL (ref 4.0–10.5)

## 2018-08-28 LAB — LDL CHOLESTEROL, DIRECT: Direct LDL: 163 mg/dL

## 2018-08-28 NOTE — Patient Instructions (Signed)
Please return in 12 months for your annual complete physical; please come fasting.  I will release your lab results to you on your MyChart account with further instructions. Please reply with any questions.    If you have any questions or concerns, please don't hesitate to send me a message via MyChart or call the office at [phone]. Thank you for visiting with [place] today! It's our pleasure caring for you.  Please do these things to maintain good health!   Exercise at least 30-45 minutes a day,  4-5 days a week.   Eat a low-fat diet with lots of fruits and vegetables, up to 7-9 servings per day.  Drink plenty of water daily. Try to drink 8 8oz glasses per day.  Seatbelts can save your life. Always wear your seatbelt.  Place Smoke Detectors on every level of your home and check batteries every year.  Schedule an appointment with an eye doctor for an eye exam every 1-2 years  Safe sex - use condoms to protect yourself from STDs if you could be exposed to these types of infections. Use birth control if you do not want to become pregnant and are sexually active.  Avoid heavy alcohol use. If you drink, keep it to less than 2 drinks/day and not every day.  [place].  Choose someone you trust that could speak for you if you became unable to speak for yourself.  Depression is common in our stressful world.If you're feeling down or losing interest in things you normally enjoy, please come in for a visit.  If anyone is threatening or hurting you, please get help. Physical or Emotional Violence is never OK.

## 2018-08-28 NOTE — Progress Notes (Signed)
Subjective  Chief Complaint  Patient presents with  . Annual Exam    pt here for CPE , recent crohns flare up - soon will be off prednisone , pt sts she is feeling good otherwise.     HPI: Shannon Singh is a 37 y.o. female who presents to [place] at [facility] today for a Female Wellness Visit.  , Wellness Visit: annual visit with health maintenance review and exam without Pap sees GYN   37 year old with Crohn's disease tapering of prednisone after acute Crohn's flare and doing well.  Here for complete physical.  Mother of 56-year-old twins.  Exercises regularly and lives healthy lifestyle  Chronic depression well-controlled with Wellbutrin.  Acne on spironolactone.  Due for potassium check.  Uses birth control pills with regular menses for contraception  All immunizations up-to-date including Gardasil.  Assessment  1. Annual physical exam   2. Crohn's disease of both small and large intestine without complication ([place])   3. Acne vulgaris   4. Oral contraceptive use   5. Major depression, recurrent, chronic [facility])      Plan  Female Wellness Visit:  Age appropriate Health Maintenance and Prevention measures were discussed with patient. Included topics are cancer screening recommendations, ways to keep healthy (see AVS) including dietary and exercise recommendations, regular eye and dental care, use of seat belts, and avoidance of moderate alcohol use and tobacco use.   BMI: discussed patient's BMI and encouraged positive lifestyle modifications to help get to or maintain a target BMI.  HM needs and immunizations were addressed and ordered. See below for orders. See HM and immunization section for updates.  Routine labs and screening tests ordered including cmp, cbc and lipids where appropriate.  Discussed recommendations regarding Vit D and calcium supplementation (see AVS)  Follow up: Return in about 1 year (around 08/29/2019) for complete physical.    Orders Placed This Encounter  Procedures  . CBC with Differential/Platelet  . Comprehensive metabolic panel  . Lipid panel  . HIV Antibody (routine testing w rflx)   No orders of the defined types were placed in this encounter.     Lifestyle: Body mass index is 28.6 kg/m. Wt Readings from Last 3 Encounters:  08/28/18 177 lb 3.2 oz (80.4 kg)  08/05/18 173 lb (78.5 kg)  06/25/18 178 lb (80.7 kg)   Diet: low fat Exercise: frequently,  Need for contraception: Yes, OCP (estrogen/progesterone)  Patient Active Problem List   Diagnosis Date Noted  . Oral contraceptive use 08/28/2018  . Major depression, recurrent, chronic ([place]) 11/28/2017    Overview:  Diagnosed as college student after death of grandfather; treated successfully with zoloft x 1 year; has been well since.   . Acne 11/28/2017    On spironalactone per derm   . History of systemic steroid therapy 07/26/2013  . Crohn's ileocolitis ([place]) 06/03/2012    Treating holistically: Ronna Polio; seeing her since 2017; currently well controlled. Sees Dr Carlean Purl for GI.  Dxd: 01/28/2007. Findings showed colitis from about 10-40 cm with patches of ulcerations and erosions, 2 erosions in the mid right colon, a few erosions in the cecum, and a small erosions in the terminal ileum 09/02/2013 Colonoscopy and terminal ileoscopy - in remission 2019 - flared off meds - active dz on colonoscopy and retx prednisone and Lialda    . Migraine headache 10/03/2007    Overview:     Health Maintenance  Topic Date Due  . TETANUS/TDAP  01/05/2020  . COLONOSCOPY  06/25/2020  . PAP SMEAR  08/12/2020  . INFLUENZA VACCINE  Completed  . HIV Screening  Completed   Immunization History  Administered Date(s) Administered  . HPV 9-valent 11/28/2017, 01/30/2018  . HPV Quadrivalent 06/01/2018  . Influenza,inj,Quad PF,6+ Mos 06/29/2018  . Influenza,trivalent, recombinat, inj, PF 07/27/2011  . Influenza-Unspecified 06/28/2013, 06/23/2017   . PPD Test 12/15/2017, 12/15/2017   We updated and reviewed the patient's past history in detail and it is documented below. Allergies: Patient has No Known Allergies. Past Medical History Patient  has a past medical history of Allergy, Anemia, Asthma, Chicken pox, Crohn's ileocolitis ([place]) (06/03/2012), Depression, Heart murmur, and History of migraine headaches. Past Surgical History Patient  has a past surgical history that includes Colonoscopy w/ biopsies (2008, 2014); Cesarean section (12/2009); Colonoscopy; and Mouth surgery. Family History: Patient family history includes Colon polyps in her father; Healthy in her daughter and son; Heart disease in her maternal grandmother; Prostate cancer in her maternal grandfather. Social History:  Patient  reports that she has never smoked. She has never used smokeless tobacco. She reports that she drinks alcohol. She reports that she does not use drugs.  Review of Systems: Constitutional: negative for fever or malaise Ophthalmic: negative for photophobia, double vision or loss of vision Cardiovascular: negative for chest pain, dyspnea on exertion, or new LE swelling Respiratory: negative for SOB or persistent cough Gastrointestinal: negative for abdominal pain, change in bowel habits or melena Genitourinary: negative for dysuria or gross hematuria, no abnormal uterine bleeding or disharge Musculoskeletal: negative for new gait disturbance or muscular weakness Integumentary: negative for new or persistent rashes, no breast lumps Neurological: negative for TIA or stroke symptoms Psychiatric: negative for SI or delusions Allergic/Immunologic: negative for hives Patient Care Team    Relationship Specialty Notifications Start End  Leamon Arnt, MD PCP - General Family Medicine  11/28/17   Gatha Mayer, MD Consulting Physician Gastroenterology  11/28/17     Objective  Vitals: BP 106/90 (BP Location: Right Arm, Patient Position: Sitting, Cuff  Size: Normal)   Pulse 68   Temp 97.9 F (36.6 C) (Oral)   Ht 5' 6"  (1.676 m)   Wt 177 lb 3.2 oz (80.4 kg)   LMP 07/29/2018   SpO2 98%   BMI 28.60 kg/m  General:  Well developed, well nourished, no acute distress  Psych:  Alert and orientedx3,normal mood and affect HEENT:  Normocephalic, atraumatic, non-icteric sclera, PERRL, oropharynx is clear without mass or exudate, supple neck without adenopathy, mass or thyromegaly Cardiovascular:  Normal S1, S2, RRR without gallop, rub or murmur, nondisplaced PMI Respiratory:  Good breath sounds bilaterally, CTAB with normal respiratory effort Gastrointestinal: normal bowel sounds, soft, non-tender, no noted masses. No HSM MSK: no deformities, contusions. Joints are without erythema or swelling. Spine and CVA region are nontender Skin:  Warm, no rashes or suspicious lesions noted Neurologic:    Mental status is normal. CN 2-11 are normal. Gross motor and sensory exams are normal. Normal gait. No tremor   Commons side effects, risks, benefits, and alternatives for medications and treatment plan prescribed today were discussed, and the patient expressed understanding of the given instructions. Patient is instructed to call or message via MyChart if he/she has any questions or concerns regarding our treatment plan. No barriers to understanding were identified. We discussed Red Flag symptoms and signs in detail. Patient expressed understanding regarding what to do in case of urgent or emergency type symptoms.   Medication list was reconciled, printed and  provided to the patient in AVS. Patient instructions and summary information was reviewed with the patient as documented in the AVS. This note was prepared with assistance of Dragon voice recognition software. Occasional wrong-word or sound-a-like substitutions may have occurred due to the inherent limitations of voice recognition software

## 2018-08-29 LAB — HIV ANTIBODY (ROUTINE TESTING W REFLEX): HIV 1&2 Ab, 4th Generation: NONREACTIVE

## 2018-11-30 DIAGNOSIS — L219 Seborrheic dermatitis, unspecified: Secondary | ICD-10-CM | POA: Diagnosis not present

## 2018-12-06 ENCOUNTER — Other Ambulatory Visit: Payer: Self-pay | Admitting: Family Medicine

## 2019-06-14 ENCOUNTER — Other Ambulatory Visit: Payer: Self-pay | Admitting: Family Medicine

## 2019-08-30 ENCOUNTER — Telehealth: Payer: Self-pay

## 2019-08-30 ENCOUNTER — Telehealth: Payer: Self-pay | Admitting: Internal Medicine

## 2019-08-30 MED ORDER — MESALAMINE 1.2 G PO TBEC: 2.4000 g | DELAYED_RELEASE_TABLET | Freq: Two times a day (BID) | ORAL | 0 refills | Status: DC

## 2019-08-30 NOTE — Telephone Encounter (Signed)
I refilled her Lialda and she has an appointment in January.

## 2019-08-30 NOTE — Telephone Encounter (Signed)
Copied from [place] [id number]. Topic: Appointment Scheduling - Prior Auth Required for Appointment >> Aug 30, 2019 11:31 AM Robina Ade, Helene Kelp D wrote: No appointment has been scheduled. Patient is requesting transfer of care appointment with Dr. Jonni Sanger. She was her patient at [facility]. Per scheduling protocol, this appointment requires a prior authorization prior to scheduling.  Please call patient is she can transfer of care and have her CPE done there with her.  Route to department's PEC pool.

## 2019-09-01 ENCOUNTER — Other Ambulatory Visit: Payer: Self-pay

## 2019-09-02 ENCOUNTER — Encounter: Payer: 59 | Admitting: Family Medicine

## 2019-09-02 ENCOUNTER — Ambulatory Visit: Payer: Self-pay

## 2019-09-02 ENCOUNTER — Encounter: Payer: Self-pay | Admitting: Family Medicine

## 2019-09-02 ENCOUNTER — Ambulatory Visit: Payer: 59 | Admitting: Family Medicine

## 2019-09-02 VITALS — BP 114/74 | HR 65 | Temp 98.1°F | Ht 66.0 in | Wt 181.0 lb

## 2019-09-02 DIAGNOSIS — M25521 Pain in right elbow: Secondary | ICD-10-CM | POA: Diagnosis not present

## 2019-09-02 NOTE — Progress Notes (Signed)
Subjective:    CC: Right elbow pain  HPI: Azalea is a 38 year old right-hand-dominant woman who has been experiencing right lateral elbow pain for approximately a week.  In her normal daily life she excellently bumped the lateral aspect of her elbow against the door jam twice in 1 day.  She has developed pain in this area worse with activity better with rest.  Pain is worse with especially wrist extension type activities and twisting activities.  She denies any radiating pain weakness or numbness distally.  She is not tried much treatment yet.  She is done some massage and some relative rest which has not helped much at all.  She was seen by her primary care provider today who transferred the visit to me/referred to me today for further evaluation and management.    Past medical history, Surgical history, Family history not pertinant except as noted below, Social history, Allergies, and medications have been entered into the medical record, reviewed, and no changes needed.   Review of Systems: No headache, visual changes, nausea, vomiting, diarrhea, constipation, dizziness, abdominal pain, skin rash, fevers, chills, night sweats, weight loss, swollen lymph nodes, body aches, joint swelling, muscle aches, chest pain, shortness of breath, mood changes, visual or auditory hallucinations.   Objective:      BP 114/74 (BP Location: Left Arm, Patient Position: Sitting, Cuff Size: Normal)  Pulse 65  Temp 98.1 F (36.7 C) (Temporal)  Ht 5' 6"  (1.676 m)  Wt 181 lb (82.1 kg)  SpO2 97%  BMI 29.21 kg/m  BSA 1.96 m    General: Well Developed, well nourished, and in no acute distress.  Neuro/Psych: Alert and oriented x3, extra-ocular muscles intact, able to move all 4 extremities, sensation grossly intact. Skin: Warm and dry, no rashes noted.  Respiratory: Not using accessory muscles, speaking in full sentences, trachea midline.  Cardiovascular: Pulses palpable, no extremity edema.  Abdomen: Does not appear distended. MSK:  Right elbow largely normal-appearing slightly swollen at lateral epicondyle.  No bruising or erythema. Range of motion full flexion and extension.  Full supination and pronation. Tender to palpation at lateral epicondyle nontender otherwise including detailed palpation at radial head. Strength is intact to flexion extension pronation and supination. Pain is reproduced and worsened at lateral epicondyle with resisted wrist and finger extension.  Hand and wrist flexion are nontender and normal. Pulses cap refill and sensation are intact distal upper extremities bilaterally.  Lab and Radiology Results  Limited musculoskeletal ultrasound right lateral elbow. Lateral epicondyle radial head and lateral joint visualized. No significant bony defects or abnormalities visualized.  No joint effusion. Soft tissue swelling at insertion of common extensor tendon at lateral epicondyle present.  No significant increased Doppler activity. No significant tendon defect visible. Impression: Contusion at lateral epicondyle with mild lateral epicondylitis.  No visible fractures on ultrasound.  Impression and Recommendations:    Assessment and Plan: 38 y.o. female with  Lateral elbow pain.  Contusion.  However this will behave more like lateral epicondylitis.  Plan to treat with diclofenac gel, padding, and stretching and eccentric exercises at home.  Recheck in a few weeks if not improving.  Precautions reviewed.  We will hold off on x-ray today.Marland Kitchen  PDMP not reviewed this encounter. Orders Placed This Encounter  Procedures  . No Chg [place] upper Rt    Order Specific Question:   Reason for Exam (SYMPTOM  OR DIAGNOSIS REQUIRED)    Answer:   eval lateral elbow pain    Order  Specific Question:   Preferred imaging location?    Answer:   [facility]   No orders of the defined types were placed in this encounter.   Discussed warning signs or symptoms. Please  see discharge instructions. Patient expresses understanding.

## 2019-09-02 NOTE — Patient Instructions (Signed)
Please follow up as scheduled for your next visit with me: 10/06/2019   If you have any questions or concerns, please don't hesitate to send me a message via MyChart or call the office at [phone]. Thank you for visiting with [place] today! It's our pleasure caring for you.

## 2019-09-02 NOTE — Patient Instructions (Signed)
Thank you for coming in today. This is going to act like tennis elbow.  Use the voltaren gel 4x daily for pain.  Use an elbow pad or body helix full elbow sleeve.  Do the stretch as much as you can. Remember to keep the elbow straight with stretch.  Do the exercise with elbow straight go from up to down position with 1-2 pound hand weight slowly over 3 seconds.  30 reps 2-3x daily.  Recheck in 2-4 weeks if not getting better.    We're moving!  Dr. Clovis Riley new office will be located at [address] on the 1st floor.  This location is across the street from the [employer] and in the same complex as the [facility] and [employer]  Our new office phone number will be [phone].  We anticipate beginning to see patients at the [facility] office in early December 2020.

## 2019-09-03 NOTE — Progress Notes (Signed)
Erroneous encounter

## 2019-09-07 ENCOUNTER — Encounter: Payer: 59 | Admitting: Family Medicine

## 2019-10-05 ENCOUNTER — Other Ambulatory Visit: Payer: Self-pay

## 2019-10-06 ENCOUNTER — Ambulatory Visit: Payer: 59 | Admitting: Internal Medicine

## 2019-10-06 ENCOUNTER — Encounter: Payer: Self-pay | Admitting: Internal Medicine

## 2019-10-06 ENCOUNTER — Encounter: Payer: Self-pay | Admitting: Family Medicine

## 2019-10-06 ENCOUNTER — Ambulatory Visit (INDEPENDENT_AMBULATORY_CARE_PROVIDER_SITE_OTHER): Payer: 59 | Admitting: Family Medicine

## 2019-10-06 VITALS — BP 120/80 | HR 53 | Temp 97.2°F | Ht 66.0 in | Wt 181.8 lb

## 2019-10-06 DIAGNOSIS — F339 Major depressive disorder, recurrent, unspecified: Secondary | ICD-10-CM

## 2019-10-06 DIAGNOSIS — K5080 Crohn's disease of both small and large intestine without complications: Secondary | ICD-10-CM | POA: Diagnosis not present

## 2019-10-06 DIAGNOSIS — Z3041 Encounter for surveillance of contraceptive pills: Secondary | ICD-10-CM | POA: Diagnosis not present

## 2019-10-06 DIAGNOSIS — Z0000 Encounter for general adult medical examination without abnormal findings: Secondary | ICD-10-CM | POA: Diagnosis not present

## 2019-10-06 DIAGNOSIS — L700 Acne vulgaris: Secondary | ICD-10-CM

## 2019-10-06 MED ORDER — MESALAMINE 1.2 G PO TBEC: 2.4000 g | DELAYED_RELEASE_TABLET | Freq: Two times a day (BID) | ORAL | 3 refills | Status: DC

## 2019-10-06 NOTE — Patient Instructions (Signed)
Glad you are well and hope things stay that way.  I have refilled Lialda for 1 year.  See you in about 1 year - sooner if needed (hope not but we are here).  I appreciate the opportunity to care for you. Gatha Mayer, MD, [facility]

## 2019-10-06 NOTE — Progress Notes (Signed)
   Shannon Singh 39 y.o. [birth year] [id number]  Assessment & Plan:   Crohn's ileocolitis ([place]) No problems Doing well Labs at PCP today RTC 1 yr - stay on Lialda   I appreciate the opportunity to care for this patient. CC: Leamon Arnt, MD   Subjective:   Chief Complaint: Follow-up of Crohn's ileocolitis  HPI Shannon Singh is here for an annual follow-up on Lialda 2.4 g twice daily reporting no significant symptoms from her Crohn's ileocolitis.  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  This was in October.  Pathology correlated with this inflammation the terminal ileum was normal and biopsies were normal there as well.  No other inflammation seen or detected by biopsies.  She was restarted on Lialda at that time as well as a prednisone taper and has been in remission clinically since.  She is due to see Dr. Jonni Sanger for annual follow-up today and will have lab testing done.  Family is doing okay.  Her twins are in school at [employer] going back in person this month and that is going well so far.  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  Labs last year in December with normal CBC and CMET.  Due again this month actually today.  Will follow up. No Known Allergies Current Meds  Medication Sig  . levocetirizine (XYZAL) 2.5 MG/5ML solution Take 2.5 mg by mouth every evening. For allergies  . mesalamine (LIALDA) 1.2 g EC tablet Take 2 tablets (2.4 g total) by mouth 2 (two) times daily with a meal.  . montelukast (SINGULAIR) 10 MG tablet Take 10 mg by mouth at bedtime.  . norethindrone-ethinyl estradiol (JUNEL FE,GILDESS FE,LOESTRIN FE) 1-20 MG-MCG tablet Take 1 tablet by mouth daily.  . Probiotic Product (PROBIOTIC DAILY PO) Take 1 tablet by mouth daily. Pt takes, Probiotic of [place] brand  . sertraline (ZOLOFT) 50  MG tablet TAKE 1 TABLET BY MOUTH EVERY DAY  . spironolactone (ALDACTONE) 50 MG tablet TK 1 T PO D WF   Past Medical History:  Diagnosis Date  . Allergy   . Anemia    borderline  . Asthma    exrecised induced  . Chicken pox   . Crohn's ileocolitis ([place]) 06/03/2012  . Depression   . Heart murmur    mild murmur  . History of migraine headaches    Past Surgical History:  Procedure Laterality Date  . CESAREAN SECTION  12/2009   twins  . COLONOSCOPY    . COLONOSCOPY W/ BIOPSIES  2008, 2014  . MOUTH SURGERY     7th- 8th grade    Social History   Social History Narrative   Married, and children born 2011, 1 son 1 daughter - twins   Employed in [employer]   family history includes Colon polyps in her father; Healthy in her daughter and son; Heart disease in her maternal grandmother; Prostate cancer in her maternal grandfather.   Review of Systems As per HPI  Objective:   Physical Exam BP 122/78 (BP Location: Left Arm, Patient Position: Sitting, Cuff Size: Normal)   Pulse (!) 56   Temp (!) 97.4 F (36.3 C)   Ht 5' 6"  (1.676 m)   Wt 183 lb 8 oz (83.2 kg)   SpO2 98%   BMI 29.62 kg/m

## 2019-10-06 NOTE — Patient Instructions (Addendum)
Please return in 12 months for your annual complete physical; please come fasting.  I will release your lab results to you on your MyChart account with further instructions. Please reply with any questions.   Your weight is stable, just to let you know. Good luck with your healthy eating plan! Wt Readings from Last 3 Encounters:  10/06/19 181 lb 12.8 oz (82.5 kg)  10/06/19 183 lb 8 oz (83.2 kg)  09/02/19 181 lb (82.1 kg)   If you have any questions or concerns, please don't hesitate to send me a message via MyChart or call the office at [phone]. Thank you for visiting with [place] today! It's our pleasure caring for you.   Preventive Care 40-25 Years Old, Female Preventive care refers to visits with your health care provider and lifestyle choices that can promote health and wellness. This includes:  A yearly physical exam. This may also be called an annual well check.  Regular dental visits and eye exams.  Immunizations.  Screening for certain conditions.  Healthy lifestyle choices, such as eating a healthy diet, getting regular exercise, not using drugs or products that contain nicotine and tobacco, and limiting alcohol use. What can I expect for my preventive care visit? Physical exam Your health care provider will check your:  Height and weight. This may be used to calculate body mass index (BMI), which tells if you are at a healthy weight.  Heart rate and blood pressure.  Skin for abnormal spots. Counseling Your health care provider may ask you questions about your:  Alcohol, tobacco, and drug use.  Emotional well-being.  Home and relationship well-being.  Sexual activity.  Eating habits.  Work and work Statistician.  Method of birth control.  Menstrual cycle.  Pregnancy history. What immunizations do I need?  Influenza (flu) vaccine  This is recommended every year. Tetanus, diphtheria, and pertussis (Tdap) vaccine  You may need a Td booster every 10  years. Varicella (chickenpox) vaccine  You may need this if you have not been vaccinated. Human papillomavirus (HPV) vaccine  If recommended by your health care provider, you may need three doses over 6 months. Measles, mumps, and rubella (MMR) vaccine  You may need at least one dose of MMR. You may also need a second dose. Meningococcal conjugate (MenACWY) vaccine  One dose is recommended if you are age 27-21 years and a first-year college student living in a residence hall, or if you have one of several medical conditions. You may also need additional booster doses. Pneumococcal conjugate (PCV13) vaccine  You may need this if you have certain conditions and were not previously vaccinated. Pneumococcal polysaccharide (PPSV23) vaccine  You may need one or two doses if you smoke cigarettes or if you have certain conditions. Hepatitis A vaccine  You may need this if you have certain conditions or if you travel or work in places where you may be exposed to hepatitis A. Hepatitis B vaccine  You may need this if you have certain conditions or if you travel or work in places where you may be exposed to hepatitis B. Haemophilus influenzae type b (Hib) vaccine  You may need this if you have certain conditions. You may receive vaccines as individual doses or as more than one vaccine together in one shot (combination vaccines). Talk with your health care provider about the risks and benefits of combination vaccines. What tests do I need?  Blood tests  Lipid and cholesterol levels. These may be checked every 5 years starting  at age 14.  Hepatitis C test.  Hepatitis B test. Screening  Diabetes screening. This is done by checking your blood sugar (glucose) after you have not eaten for a while (fasting).  Sexually transmitted disease (STD) testing.  BRCA-related cancer screening. This may be done if you have a family history of breast, ovarian, tubal, or peritoneal cancers.  Pelvic  exam and Pap test. This may be done every 3 years starting at age 50. Starting at age 45, this may be done every 5 years if you have a Pap test in combination with an HPV test. Talk with your health care provider about your test results, treatment options, and if necessary, the need for more tests. Follow these instructions at home: Eating and drinking   Eat a diet that includes fresh fruits and vegetables, whole grains, lean protein, and low-fat dairy.  Take vitamin and mineral supplements as recommended by your health care provider.  Do not drink alcohol if: ? Your health care provider tells you not to drink. ? You are pregnant, may be pregnant, or are planning to become pregnant.  If you drink alcohol: ? Limit how much you have to 0-1 drink a day. ? Be aware of how much alcohol is in your drink. In the [place] one drink equals one 12 oz bottle of beer (355 mL), one 5 oz glass of wine (148 mL), or one 1 oz glass of hard liquor (44 mL). Lifestyle  Take daily care of your teeth and gums.  Stay active. Exercise for at least 30 minutes on 5 or more days each week.  Do not use any products that contain nicotine or tobacco, such as cigarettes, e-cigarettes, and chewing tobacco. If you need help quitting, ask your health care provider.  If you are sexually active, practice safe sex. Use a condom or other form of birth control (contraception) in order to prevent pregnancy and STIs (sexually transmitted infections). If you plan to become pregnant, see your health care provider for a preconception visit. What's next?  Visit your health care provider once a year for a well check visit.  Ask your health care provider how often you should have your eyes and teeth checked.  Stay up to date on all vaccines. This information is not intended to replace advice given to you by your health care provider. Make sure you discuss any questions you have with your health care provider. Document Revised:  05/21/2018 Document Reviewed: 05/21/2018 Elsevier Patient Education  2020 [employer].

## 2019-10-06 NOTE — Progress Notes (Signed)
Subjective  Chief Complaint  Patient presents with  . Annual Exam    HPI: Shannon Singh is a 39 y.o. female who presents to [facility] [place] today for a Female Wellness Visit.  She also has the concerns and/or needs as listed above in the chief complaint. These will be addressed in addition to the Health Maintenance Visit.   Wellness Visit: annual visit with health maintenance review and exam without Pap   HM: all up to date. Nl pap. Feeling well. Has had a good year in spite of covid. 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 Children are well; returned to school last week. Starting to diet next week: goal weight loss 30 pounds. Chronic disease management visit and/or acute problem visit:  Acne,depression and OCP use are all stable. No AEs. No concerns.  Assessment  1. Annual physical exam   2. Crohn's disease of both small and large intestine without complication ([place])   3. Major depression, recurrent, chronic ([place])   4. Oral contraceptive use   5. Acne vulgaris      Plan  Female Wellness Visit:  Age appropriate Health Maintenance and Prevention measures were discussed with patient. Included topics are cancer screening recommendations, ways to keep healthy (see AVS) including dietary and exercise recommendations, regular eye and dental care, use of seat belts, and avoidance of moderate alcohol use and tobacco use.   BMI: discussed patient's BMI and encouraged positive lifestyle modifications to help get to or maintain a target BMI.  HM needs and immunizations were addressed and ordered. See below for orders. See HM and immunization section for updates.  Routine labs and screening tests ordered including cmp, cbc and lipids where appropriate.  Discussed recommendations regarding Vit D and calcium supplementation (see AVS)  Chronic disease f/u and/or acute problem visit: (deemed necessary to be done in addition to the wellness visit):  Acne/ocp  use/depression:  These medical conditions are well controlled. There are no signs of complications, medication side effects, or red flags. Patient is instructed to continue the current treatment plan without change in therapies or medications.    Follow up: Return in about 1 year (around 10/05/2020) for complete physical.   Orders Placed This Encounter  Procedures  . CBC w/Diff  . CMP  . Lipids  . TSH   No orders of the defined types were placed in this encounter.     Lifestyle: Body mass index is 29.34 kg/m. Wt Readings from Last 3 Encounters:  10/06/19 181 lb 12.8 oz (82.5 kg)  10/06/19 183 lb 8 oz (83.2 kg)  09/02/19 181 lb (82.1 kg)    Patient Active Problem List   Diagnosis Date Noted  . Oral contraceptive use 08/28/2018  . Major depression, recurrent, chronic ([place]) 11/28/2017    Overview:  Diagnosed as college student after death of grandfather; treated successfully with zoloft.   . Acne 11/28/2017    On spironalactone per derm   . History of systemic steroid therapy 07/26/2013  . Crohn's ileocolitis ([place]) 06/03/2012    Treating holistically: Ronna Polio; seeing her since 2017; currently well controlled. Sees Dr Carlean Purl for GI.  Dxd: 01/28/2007. Findings showed colitis from about 10-40 cm with patches of ulcerations and erosions, 2 erosions in the mid right colon, a few erosions in the cecum, and a small erosions in the terminal ileum 09/02/2013 Colonoscopy and terminal ileoscopy - in remission 2019 - flared off meds - active dz on colonoscopy and retx prednisone  and Lialda    . Migraine headache 10/03/2007    Overview:     Health Maintenance  Topic Date Due  . TETANUS/TDAP  01/05/2020  . COLONOSCOPY  06/25/2020  . PAP SMEAR-Modifier  04/23/2022  . INFLUENZA VACCINE  Completed  . HIV Screening  Completed   Immunization History  Administered Date(s) Administered  . HPV 9-valent 11/28/2017, 01/30/2018  . HPV Quadrivalent 06/01/2018  . Influenza Nasal  07/08/2019  . Influenza,inj,Quad PF,6+ Mos 06/29/2018  . Influenza,trivalent, recombinat, inj, PF 07/27/2011  . Influenza-Unspecified 06/28/2013, 06/23/2017  . PPD Test 12/15/2017, 12/15/2017   We updated and reviewed the patient's past history in detail and it is documented below. Allergies: Patient  reports current alcohol use. Past Medical History Patient  has a past medical history of Allergy, Anemia, Asthma, Chicken pox, Crohn's ileocolitis ([place]) (06/03/2012), Depression, Heart murmur, and History of migraine headaches. Past Surgical History Patient  has a past surgical history that includes Colonoscopy w/ biopsies (2008, 2014); Cesarean section (12/2009); Colonoscopy; and Mouth surgery. Social History   Socioeconomic History  . Marital status: Married    Spouse name: Not on file  . Number of children: 2  . Years of education: Not on file  . Highest education level: Not on file  Occupational History  . Occupation: botox rep for urology, bladder, [employer]    Comment: nov 2018  . Occupation: Biochemist, clinical  Tobacco Use  . Smoking status: Never Smoker  . Smokeless tobacco: Never Used  Substance and Sexual Activity  . Alcohol use: Yes    Comment: 1 bottle wine/week, social  . Drug use: No  . Sexual activity: Yes    Partners: Male    Birth control/protection: Pill    Comment: Vasectomy   Other Topics Concern  . Not on file  Social History Narrative   Married, and children born 2011, 1 son 1 daughter - twins   Employed in [employer]   Social Determinants of Health   Financial Resource Strain:   . Difficulty of Paying Living Expenses: Not on file  Food Insecurity:   . Worried About Charity fundraiser in the Last Year: Not on file  . Ran Out of Food in the Last Year: Not on file  Transportation Needs:   . Lack of Transportation (Medical): Not on file  . Lack of Transportation (Non-Medical): Not on file  Physical Activity:   . Days of Exercise per Week: Not on  file  . Minutes of Exercise per Session: Not on file  Stress:   . Feeling of Stress : Not on file  Social Connections:   . Frequency of Communication with Friends and Family: Not on file  . Frequency of Social Gatherings with Friends and Family: Not on file  . Attends Religious Services: Not on file  . Active Member of Clubs or Organizations: Not on file  . Attends Archivist Meetings: Not on file  . Marital Status: Not on file   Family History  Problem Relation Age of Onset  . Colon polyps Father   . Prostate cancer Maternal Grandfather   . Heart disease Maternal Grandmother   . Healthy Daughter   . Healthy Son   . Breast cancer Neg Hx   . Colon cancer Neg Hx   . Ovarian cancer Neg Hx   . Esophageal cancer Neg Hx   . Rectal cancer Neg Hx   . Stomach cancer Neg Hx     Review of Systems: Constitutional: negative  for fever or malaise Ophthalmic: negative for photophobia, double vision or loss of vision Cardiovascular: negative for chest pain, dyspnea on exertion, or new LE swelling Respiratory: negative for SOB or persistent cough Gastrointestinal: negative for abdominal pain, change in bowel habits or melena Genitourinary: negative for dysuria or gross hematuria, no abnormal uterine bleeding or disharge Musculoskeletal: negative for new gait disturbance or muscular weakness Integumentary: negative for new or persistent rashes, no breast lumps Neurological: negative for TIA or stroke symptoms Psychiatric: negative for SI or delusions Allergic/Immunologic: negative for hives  Patient Care Team    Relationship Specialty Notifications Start End  Leamon Arnt, MD PCP - General Family Medicine  11/28/17   Gatha Mayer, MD Consulting Physician Gastroenterology  11/28/17   Marylynn Pearson, MD Consulting Physician Obstetrics and Gynecology  10/06/19     Objective  Vitals: BP 120/80 (BP Location: Right Arm, Patient Position: Sitting, Cuff Size: Normal)   Pulse (!) 53    Temp (!) 97.2 F (36.2 C) (Temporal)   Ht 5' 6"  (1.676 m)   Wt 181 lb 12.8 oz (82.5 kg)   LMP 09/12/2019 (Approximate)   SpO2 97%   BMI 29.34 kg/m  General:  Well developed, well nourished, no acute distress  Psych:  Alert and orientedx3,normal mood and affect HEENT:  Normocephalic, atraumatic, non-icteric sclera, PERRL, oropharynx is clear without mass or exudate, supple neck without adenopathy, mass or thyromegaly Cardiovascular:  Normal S1, S2, RRR without gallop, rub or murmur, nondisplaced PMI Respiratory:  Good breath sounds bilaterally, CTAB with normal respiratory effort Gastrointestinal: normal bowel sounds, soft, non-tender, no noted masses. No HSM MSK: no deformities, contusions. Joints are without erythema or swelling. Spine and CVA region are nontender Skin:  Warm, no rashes or suspicious lesions noted Neurologic:    Mental status is normal. CN 2-11 are normal. Gross motor and sensory exams are normal. Normal gait. No tremor     Commons side effects, risks, benefits, and alternatives for medications and treatment plan prescribed today were discussed, and the patient expressed understanding of the given instructions. Patient is instructed to call or message via MyChart if he/she has any questions or concerns regarding our treatment plan. No barriers to understanding were identified. We discussed Red Flag symptoms and signs in detail. Patient expressed understanding regarding what to do in case of urgent or emergency type symptoms.   Medication list was reconciled, printed and provided to the patient in AVS. Patient instructions and summary information was reviewed with the patient as documented in the AVS. This note was prepared with assistance of Dragon voice recognition software. Occasional wrong-word or sound-a-like substitutions may have occurred due to the inherent limitations of voice recognition software  This visit occurred during the SARS-CoV-2 public health  emergency.  Safety protocols were in place, including screening questions prior to the visit, additional usage of staff PPE, and extensive cleaning of exam room while observing appropriate contact time as indicated for disinfecting solutions.

## 2019-10-06 NOTE — Assessment & Plan Note (Signed)
No problems Doing well Labs at PCP today RTC 1 yr - stay on Lialda

## 2019-10-07 LAB — COMPREHENSIVE METABOLIC PANEL
ALT: 21 U/L (ref 0–35)
AST: 22 U/L (ref 0–37)
Albumin: 4.3 g/dL (ref 3.5–5.2)
Alkaline Phosphatase: 67 U/L (ref 39–117)
BUN: 15 mg/dL (ref 6–23)
CO2: 25 mEq/L (ref 19–32)
Calcium: 9.3 mg/dL (ref 8.4–10.5)
Chloride: 103 mEq/L (ref 96–112)
Creatinine, Ser: 0.8 mg/dL (ref 0.40–1.20)
GFR: 79.54 mL/min (ref >60.00)
Glucose, Bld: 73 mg/dL (ref 70–99)
Potassium: 4.3 mEq/L (ref 3.5–5.1)
Sodium: 136 mEq/L (ref 135–145)
Total Bilirubin: 0.3 mg/dL (ref 0.2–1.2)
Total Protein: 7.2 g/dL (ref 6.0–8.3)

## 2019-10-07 LAB — LIPID PANEL
Cholesterol: 221 mg/dL — ABNORMAL HIGH (ref 0–200)
HDL: 53.7 mg/dL (ref >39.00)
LDL Cholesterol: 151 mg/dL — ABNORMAL HIGH (ref 0–99)
NonHDL: 167.28
Total CHOL/HDL Ratio: 4
Triglycerides: 82 mg/dL (ref 0.0–149.0)
VLDL: 16.4 mg/dL (ref 0.0–40.0)

## 2019-10-07 LAB — TSH: TSH: 1.16 u[IU]/mL (ref 0.35–4.50)

## 2019-10-07 LAB — CBC WITH DIFFERENTIAL/PLATELET
Basophils Absolute: 0.1 10*3/uL (ref 0.0–0.1)
Basophils Relative: 1 % (ref 0.0–3.0)
Eosinophils Absolute: 0.1 10*3/uL (ref 0.0–0.7)
Eosinophils Relative: 1.1 % (ref 0.0–5.0)
HCT: 37.2 % (ref 36.0–46.0)
Hemoglobin: 12.3 g/dL (ref 12.0–15.0)
Lymphocytes Relative: 38.8 % (ref 12.0–46.0)
Lymphs Abs: 3.3 10*3/uL (ref 0.7–4.0)
MCHC: 33 g/dL (ref 30.0–36.0)
MCV: 88.7 fl (ref 78.0–100.0)
Monocytes Absolute: 0.6 10*3/uL (ref 0.1–1.0)
Monocytes Relative: 7.3 % (ref 3.0–12.0)
Neutro Abs: 4.4 10*3/uL (ref 1.4–7.7)
Neutrophils Relative %: 51.8 % (ref 43.0–77.0)
Platelets: 350 10*3/uL (ref 150.0–400.0)
RBC: 4.19 Mil/uL (ref 3.87–5.11)
RDW: 13.7 % (ref 11.5–15.5)
WBC: 8.5 10*3/uL (ref 4.0–10.5)

## 2019-12-07 ENCOUNTER — Other Ambulatory Visit: Payer: Self-pay | Admitting: Family Medicine

## 2019-12-08 ENCOUNTER — Telehealth: Payer: Self-pay | Admitting: Internal Medicine

## 2019-12-08 NOTE — Telephone Encounter (Signed)
Left message for the patient to call back.

## 2019-12-08 NOTE — Telephone Encounter (Signed)
Spoke with the patient and told the patient that our physicians are not treating patients with medication after receiving the COVID vaccination. Verbalized understanding. No further questions.

## 2019-12-08 NOTE — Telephone Encounter (Signed)
Patient called states she received the [employer] vaccine on Sunday and she has crohn's wants to know if there is something we can give her to support her immune system.

## 2020-02-16 ENCOUNTER — Other Ambulatory Visit: Payer: Self-pay

## 2020-02-16 ENCOUNTER — Encounter: Payer: Self-pay | Admitting: Family Medicine

## 2020-02-16 ENCOUNTER — Telehealth (INDEPENDENT_AMBULATORY_CARE_PROVIDER_SITE_OTHER): Payer: 59 | Admitting: Family Medicine

## 2020-02-16 DIAGNOSIS — J0100 Acute maxillary sinusitis, unspecified: Secondary | ICD-10-CM | POA: Diagnosis not present

## 2020-02-16 MED ORDER — AMOXICILLIN-POT CLAVULANATE 875-125 MG PO TABS: 1.0000 | ORAL_TABLET | Freq: Two times a day (BID) | ORAL | 0 refills | Status: DC

## 2020-02-16 NOTE — Progress Notes (Signed)
Virtual Visit via Video Note  Subjective  CC:  Chief Complaint  Patient presents with  . Sinusitis    symptoms started over this past weekend, sinus pressure, tender behind ears and down neck, yellow muscus     I connected with MELAINA HOWERTON on 02/16/20 at  1:00 PM EDT by a video enabled telemedicine application and verified that I am speaking with the correct person using two identifiers. Location patient: Home Location provider: [facility], Office Persons participating in the virtual visit: BO ROGUE, Leamon Arnt, MD Reymundo Poll CMA  I discussed the limitations of evaluation and management by telemedicine and the availability of in person appointments. The patient expressed understanding and agreed to proceed. HPI: Shannon Singh is a 39 y.o. female who was contacted today to address the problems listed above in the chief complaint. . Had viral uri the week after mother's day and sinus congestion persisted, until now with facial pressure and behind ears. Has fullness in ears w/o pain. No f/c/s. Now having dental pain as well. Blowing out thick mucous. Mild malaise. No covid sxs: no cough, sob, lost of taste or smell, myalgias or exposures.   Assessment  1. Acute non-recurrent maxillary sinusitis      Plan   sinusitis:  Augmentin, mucinex and advil. Routine guidance. F/u if doesn't resolve or rebounds.  I discussed the assessment and treatment plan with the patient. The patient was provided an opportunity to ask questions and all were answered. The patient agreed with the plan and demonstrated an understanding of the instructions.   The patient was advised to call back or seek an in-person evaluation if the symptoms worsen or if the condition fails to improve as anticipated. Follow up: cpe due in jan 2022  Visit date not found  Meds ordered this encounter  Medications  . amoxicillin-clavulanate (AUGMENTIN) 875-125 MG tablet    Sig: Take  1 tablet by mouth 2 (two) times daily.    Dispense:  20 tablet    Refill:  0      I reviewed the patients updated PMH, FH, and SocHx.    Patient Active Problem List   Diagnosis Date Noted  . Oral contraceptive use 08/28/2018  . Major depression, recurrent, chronic ([place]) 11/28/2017  . Acne 11/28/2017  . History of systemic steroid therapy 07/26/2013  . Crohn's ileocolitis ([place]) 06/03/2012  . Migraine headache 10/03/2007   Current Meds  Medication Sig  . levocetirizine (XYZAL) 2.5 MG/5ML solution Take 2.5 mg by mouth every evening. For allergies  . mesalamine (LIALDA) 1.2 g EC tablet Take 2 tablets (2.4 g total) by mouth 2 (two) times daily with a meal.  . montelukast (SINGULAIR) 10 MG tablet Take 10 mg by mouth at bedtime.  . norethindrone-ethinyl estradiol (JUNEL FE,GILDESS FE,LOESTRIN FE) 1-20 MG-MCG tablet Take 1 tablet by mouth daily.  . Probiotic Product (PROBIOTIC DAILY PO) Take 1 tablet by mouth daily. Pt takes, Probiotic of [place] brand  . sertraline (ZOLOFT) 50 MG tablet TAKE 1 TABLET BY MOUTH EVERY DAY  . spironolactone (ALDACTONE) 50 MG tablet TK 1 T PO D WF    Allergies: Patient has No Known Allergies. Family History: Patient family history includes Colon polyps in her father; Healthy in her daughter and son; Heart disease in her maternal grandmother; Prostate cancer in her maternal grandfather. Social History:  Patient  reports that she has never smoked. She has never used smokeless tobacco. She  reports current alcohol use. She reports that she does not use drugs.  Review of Systems: Constitutional: Negative for fever malaise or anorexia Cardiovascular: negative for chest pain Respiratory: negative for SOB or persistent cough Gastrointestinal: negative for abdominal pain  OBJECTIVE Vitals: There were no vitals taken for this visit. General: no acute distress , A&Ox3  Leamon Arnt, MD

## 2020-06-01 DIAGNOSIS — K589 Irritable bowel syndrome without diarrhea: Secondary | ICD-10-CM | POA: Insufficient documentation

## 2020-06-06 ENCOUNTER — Other Ambulatory Visit: Payer: Self-pay | Admitting: Obstetrics and Gynecology

## 2020-06-06 DIAGNOSIS — R928 Other abnormal and inconclusive findings on diagnostic imaging of breast: Secondary | ICD-10-CM

## 2020-06-14 ENCOUNTER — Other Ambulatory Visit: Payer: Self-pay

## 2020-06-14 ENCOUNTER — Ambulatory Visit
Admission: RE | Admit: 2020-06-14 | Discharge: 2020-06-14 | Disposition: A | Discharge status: Home / Self Care | Payer: BC Managed Care – PPO | Source: Ambulatory Visit | Attending: Obstetrics and Gynecology | Admitting: Obstetrics and Gynecology

## 2020-06-14 ENCOUNTER — Ambulatory Visit
Admission: RE | Admit: 2020-06-14 | Discharge: 2020-06-14 | Disposition: A | Discharge status: Home / Self Care | Payer: 59 | Source: Ambulatory Visit | Attending: Obstetrics and Gynecology | Admitting: Obstetrics and Gynecology

## 2020-06-14 DIAGNOSIS — R928 Other abnormal and inconclusive findings on diagnostic imaging of breast: Secondary | ICD-10-CM

## 2020-10-19 ENCOUNTER — Encounter: Payer: Self-pay | Admitting: Internal Medicine

## 2020-11-06 ENCOUNTER — Encounter: Payer: Self-pay | Admitting: Internal Medicine

## 2020-12-15 ENCOUNTER — Other Ambulatory Visit: Payer: Self-pay

## 2020-12-15 ENCOUNTER — Ambulatory Visit (AMBULATORY_SURGERY_CENTER): Payer: 59 | Admitting: *Deleted

## 2020-12-15 VITALS — Ht 66.0 in | Wt 180.0 lb

## 2020-12-15 DIAGNOSIS — K5080 Crohn's disease of both small and large intestine without complications: Secondary | ICD-10-CM

## 2020-12-15 NOTE — Progress Notes (Signed)
Patient's pre-visit was done today over the phone with the patient due to COVID-19 pandemic. Name,DOB and address verified. Insurance verified. Patient denies any allergies to Eggs and Soy. Patient denies any problems with anesthesia/sedation. Patient denies taking diet pills or blood thinners. Packet of Prep instructions mailed to patient including a copy of a consent form-pt is aware. Patient understands to call us back with any questions or concerns. The patient is COVID-19 fully vaccinated, per patient. Patient is aware of our care-partner policy and ZOXWR-60 safety protocol.

## 2020-12-26 ENCOUNTER — Encounter: Payer: Self-pay | Admitting: Internal Medicine

## 2020-12-29 ENCOUNTER — Other Ambulatory Visit: Payer: Self-pay

## 2020-12-29 ENCOUNTER — Ambulatory Visit (AMBULATORY_SURGERY_CENTER): Payer: BC Managed Care – PPO | Admitting: Internal Medicine

## 2020-12-29 ENCOUNTER — Encounter: Payer: Self-pay | Admitting: Internal Medicine

## 2020-12-29 VITALS — BP 112/73 | HR 63 | Temp 97.5°F | Resp 12 | Ht 66.0 in | Wt 180.0 lb

## 2020-12-29 DIAGNOSIS — K5150 Left sided colitis without complications: Secondary | ICD-10-CM

## 2020-12-29 DIAGNOSIS — K5080 Crohn's disease of both small and large intestine without complications: Secondary | ICD-10-CM | POA: Diagnosis not present

## 2020-12-29 DIAGNOSIS — K529 Noninfective gastroenteritis and colitis, unspecified: Secondary | ICD-10-CM

## 2020-12-29 MED ORDER — SODIUM CHLORIDE 0.9 % IV SOLN: 500.0000 mL | Freq: Once | INTRAVENOUS | Status: DC

## 2020-12-29 NOTE — Op Note (Signed)
[facility] Patient Name: Shannon Singh Procedure Date: 12/29/2020 11:29 AM MRN: [id number] Endoscopist: Gatha Mayer , MD Age: 40 Referring MD:  Date of Birth: 08-17-1981 Gender: Female Account #: [id number] Procedure:                Colonoscopy Indications:              Crohn's disease of the small bowel and colon,                            Follow-up of Crohn's disease of the small bowel and                            colon, Disease activity assessment of Crohn's                            disease of the small bowel and colon Medicines:                Propofol per Anesthesia, Monitored Anesthesia Care Procedure:                Pre-Anesthesia Assessment:                           - Prior to the procedure, a History and Physical                            was performed, and patient medications and                            allergies were reviewed. The patient's tolerance of                            previous anesthesia was also reviewed. The risks                            and benefits of the procedure and the sedation                            options and risks were discussed with the patient.                            All questions were answered, and informed consent                            was obtained. Prior Anticoagulants: The patient has                            taken no previous anticoagulant or antiplatelet                            agents. ASA Grade Assessment: II - A patient with                            mild systemic disease. After reviewing the risks  and benefits, the patient was deemed in                            satisfactory condition to undergo the procedure.                           After obtaining informed consent, the colonoscope                            was passed under direct vision. Throughout the                            procedure, the patient's blood pressure, pulse, and                            oxygen  saturations were monitored continuously. The                            Olympus [id number]-[id number] (#[id number]) Colonoscope was                            introduced through the anus and advanced to the the                            terminal ileum, with identification of the                            appendiceal orifice and IC valve. The colonoscopy                            was performed without difficulty. The patient                            tolerated the procedure well. The quality of the                            bowel preparation was excellent. The terminal                            ileum, ileocecal valve, appendiceal orifice, and                            rectum were photographed. Scope In: 11:43:59 AM Scope Out: 11:58:11 AM Scope Withdrawal Time: 0 hours 9 minutes 26 seconds  Total Procedure Duration: 0 hours 14 minutes 12 seconds  Findings:                 The perianal and digital rectal examinations were                            normal.                           The terminal ileum appeared normal. Biopsies were  taken with a cold forceps for histology.                            Verification of patient identification for the                            specimen was done. Estimated blood loss was minimal.                           The Simple Endoscopic Score for Crohn's Disease was                            determined based on the endoscopic appearance of                            the mucosa in the following segments:                           - Ileum: Findings include no ulcers present, no                            ulcerated surfaces, no affected surfaces, no                            narrowings and no ulcers present, no ulcerated                            surfaces, no affected surfaces and no narrowings.                            Segment score: 0.                           - Right Colon: Findings include aphthous ulcers                             less than 0.5 cm in size, less than 10% ulcerated                            surfaces, less than 50% of surfaces affected and no                            narrowings. Segment score: 3.                           - Transverse Colon: Findings include aphthous                            ulcers less than 0.5 cm in size, less than 10%                            ulcerated surfaces, less than 50% of surfaces  affected and no narrowings. Segment score: 3.                           - Left Colon: Findings include aphthous ulcers less                            than 0.5 cm in size, greater than 30% ulcerated                            surfaces, 50-75% of surfaces affected and no                            narrowings. Segment score: 6.                           - Rectum: Findings include aphthous ulcers less                            than 0.5 cm in size, 10-30% ulcerated surfaces,                            less than 50% of surfaces affected and no                            narrowings. Segment score: 4.                           - Total SES-CD aggregate score: 16. Biopsies were                            taken with a cold forceps for histology.                            Verification of patient identification for the                            specimen was done. Estimated blood loss was minimal.                           The exam was otherwise without abnormality on                            direct and retroflexion views. Complications:            No immediate complications. Estimated Blood Loss:     Estimated blood loss was minimal. Impression:               - The examined portion of the ileum was normal.                            Biopsied.                           - Simple Endoscopic Score for Crohn's Disease: 16,  mucosal inflammatory changes secondary to Crohn's                            disease, with ileitis and colitis. Biopsied.                            - The examination was otherwise normal on direct                            and retroflexion views. Recommendation:           - Patient has a contact number available for                            emergencies. The signs and symptoms of potential                            delayed complications were discussed with the                            patient. Return to normal activities tomorrow.                            Written discharge instructions were provided to the                            patient.                           - Resume previous diet.                           - Continue present medications.                           - Await pathology results.                           - Repeat colonoscopy is recommended. The                            colonoscopy date will be determined after pathology                            results from today's exam become available for                            review.                           - looks like she has stopped mesalamine again. this                            is not as severe as 2019 (off meds then  also).Lialda was $800 so not filled and not taken                            in about 6 mos will look for alternatives Gatha Mayer, MD 12/29/2020 12:16:00 PM This report has been signed electronically.

## 2020-12-29 NOTE — Progress Notes (Signed)
Called to room to assist during endoscopic procedure.  Patient ID and intended procedure confirmed with present staff. Received instructions for my participation in the procedure from the performing physician.  

## 2020-12-29 NOTE — Progress Notes (Signed)
To PACU, VSS. Report to [email or web address]

## 2020-12-29 NOTE — Progress Notes (Signed)
SS - VS   Pt's states no medical or surgical changes since previsit or office visit.

## 2020-12-29 NOTE — Patient Instructions (Addendum)
There was active inflammation in the rectum left and right colons again.  The inflammation did not look as severe but the distribution was greater I think.  It looks like you are not taking the Lialda again.  I am glad you feel well but in order to control the disease I think you will need to go back on that.  We can discuss.   I think the high cost may be the deductible - look into that more to see if the Lialda will be affordable if you overcome the high deductible. The other rx Dorthy Cooler looks like has same coverage. Pentasa is on the list and probably cheaper but not used as is a 4x a day drug.  I appreciate the opportunity to care for you. 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  Resume previous diet. Continue present medications. Await pathology results.  Repeat colonoscopy recommended.  Date to be determined after pathology results reviewed.  YOU HAD AN ENDOSCOPIC PROCEDURE TODAY AT [facility]:   Refer to the procedure report that was given to you for any specific questions about what was found during the examination.  If the procedure report does not answer your questions, please call your gastroenterologist to clarify.  If you requested that your care partner not be given the details of your procedure findings, then the procedure report has been included in a sealed envelope for you to review at your convenience later.  YOU SHOULD EXPECT: Some feelings of bloating in the abdomen. Passage of more gas than usual.  Walking can help get rid of the air that was put into your GI tract during the procedure and reduce the bloating. If you had a lower endoscopy (such as a colonoscopy or flexible sigmoidoscopy) you may notice spotting of blood in your stool or on the toilet paper. If you underwent a bowel prep for your procedure, you may not have a normal bowel movement for a few days.  Please Note:  You might notice some irritation and congestion  in your nose or some drainage.  This is from the oxygen used during your procedure.  There is no need for concern and it should clear up in a day or so.  SYMPTOMS TO REPORT IMMEDIATELY:   Following lower endoscopy (colonoscopy or flexible sigmoidoscopy):  Excessive amounts of blood in the stool  Significant tenderness or worsening of abdominal pains  Swelling of the abdomen that is new, acute  Fever of 100F or higher For urgent or emergent issues, a gastroenterologist can be reached at any hour by calling [phone]. Do not use MyChart messaging for urgent concerns.    DIET:  We do recommend a small meal at first, but then you may proceed to your regular diet.  Drink plenty of fluids but you should avoid alcoholic beverages for 24 hours.  ACTIVITY:  You should plan to take it easy for the rest of today and you should NOT DRIVE or use heavy machinery until tomorrow (because of the sedation medicines used during the test).    FOLLOW UP: Our staff will call the number listed on your records 48-72 hours following your procedure to check on you and address any questions or concerns that you may have regarding the information given to you following your procedure. If we do not reach you, we will leave a message.  We will attempt to reach you two times.  During this call, we will ask if  you have developed any symptoms of COVID 19. If you develop any symptoms (ie: fever, flu-like symptoms, shortness of breath, cough etc.) before then, please call [phone].  If you test positive for Covid 19 in the 2 weeks post procedure, please call and report this information to [place].    If any biopsies were taken you will be contacted by phone or by letter within the next 1-3 weeks.  Please call us at [phone] if you have not heard about the biopsies in 3 weeks.    SIGNATURES/CONFIDENTIALITY: You and/or your care partner have signed paperwork which will be entered into your electronic medical record.   These signatures attest to the fact that that the information above on your After Visit Summary has been reviewed and is understood.  Full responsibility of the confidentiality of this discharge information lies with you and/or your care-partner.

## 2021-01-02 ENCOUNTER — Telehealth: Payer: Self-pay

## 2021-01-02 NOTE — Telephone Encounter (Signed)
  Follow up Call-  Call back number 12/29/2020 06/25/2018  Post procedure Call Back phone  # [phone] cell [phone]  Permission to leave phone message Yes Yes  Some recent data might be hidden     Patient questions:  Do you have a fever, pain , or abdominal swelling? No. Pain Score  0 *  Have you tolerated food without any problems? Yes.    Have you been able to return to your normal activities? Yes.    Do you have any questions about your discharge instructions: Diet   No. Medications  No. Follow up visit  No.  Do you have questions or concerns about your Care? No.  Actions: * If pain score is 4 or above: No action needed, pain <4. 1. Have you developed a fever since your procedure? no  2.   Have you had an respiratory symptoms (SOB or cough) since your procedure? no  3.   Have you tested positive for COVID 19 since your procedure no  4.   Have you had any family members/close contacts diagnosed with the COVID 19 since your procedure?  no   If yes to any of these questions please route to Joylene John, RN and Joella Prince, RN

## 2021-01-24 ENCOUNTER — Other Ambulatory Visit: Payer: Self-pay

## 2021-01-24 MED ORDER — MESALAMINE 1.2 G PO TBEC: 2.4000 g | DELAYED_RELEASE_TABLET | Freq: Two times a day (BID) | ORAL | 2 refills | Status: DC

## 2021-03-22 DIAGNOSIS — E349 Endocrine disorder, unspecified: Secondary | ICD-10-CM | POA: Diagnosis not present

## 2021-03-22 DIAGNOSIS — R5383 Other fatigue: Secondary | ICD-10-CM | POA: Diagnosis not present

## 2021-03-22 DIAGNOSIS — R790 Abnormal level of blood mineral: Secondary | ICD-10-CM | POA: Diagnosis not present

## 2021-03-22 DIAGNOSIS — E039 Hypothyroidism, unspecified: Secondary | ICD-10-CM | POA: Diagnosis not present

## 2021-03-22 DIAGNOSIS — N920 Excessive and frequent menstruation with regular cycle: Secondary | ICD-10-CM | POA: Diagnosis not present

## 2021-03-22 DIAGNOSIS — E559 Vitamin D deficiency, unspecified: Secondary | ICD-10-CM | POA: Diagnosis not present

## 2021-03-22 DIAGNOSIS — K5090 Crohn's disease, unspecified, without complications: Secondary | ICD-10-CM | POA: Diagnosis not present

## 2021-03-30 ENCOUNTER — Telehealth: Payer: Self-pay | Admitting: Internal Medicine

## 2021-03-30 MED ORDER — MESALAMINE 1.2 G PO TBEC: 2.4000 g | DELAYED_RELEASE_TABLET | Freq: Two times a day (BID) | ORAL | 2 refills | Status: DC

## 2021-03-30 NOTE — Telephone Encounter (Signed)
Shannon Singh came in yesterday with a form for her to get her mesalamine thru [employer]. She filled out the form and today after talking she requested that I go ahead and fax it to them at fax # [phone] along with a rx. This has been done and she will call us back if it is too expensive thru them. Their phone # is [phone]

## 2021-03-30 NOTE — Telephone Encounter (Signed)
Inbound call from patient requesting a call back with equivalent medication recommendations for mesalamine dr 1.2 g tablets 2x a day. Best contact number [phone]

## 2021-04-03 DIAGNOSIS — K5090 Crohn's disease, unspecified, without complications: Secondary | ICD-10-CM | POA: Diagnosis not present

## 2021-04-03 DIAGNOSIS — R5383 Other fatigue: Secondary | ICD-10-CM | POA: Diagnosis not present

## 2021-04-03 DIAGNOSIS — E039 Hypothyroidism, unspecified: Secondary | ICD-10-CM | POA: Diagnosis not present

## 2021-04-03 DIAGNOSIS — E538 Deficiency of other specified B group vitamins: Secondary | ICD-10-CM | POA: Diagnosis not present

## 2021-05-27 IMAGING — US US BREAST*L* LIMITED INC AXILLA
1 series · 6 of 6 positions shown · non-contrast
Comparison: Previous exam(s).

CLINICAL DATA: Patient returns after baseline screening study for
evaluation possible LEFT breast asymmetry.

EXAM:
DIGITAL DIAGNOSTIC LEFT MAMMOGRAM WITH CAD AND TOMO
ULTRASOUND LEFT BREAST

[Series 1: us breast*left* limited inc axilla · 0.07mm/px · 6 of 6 slices shown]
[im 1/6]
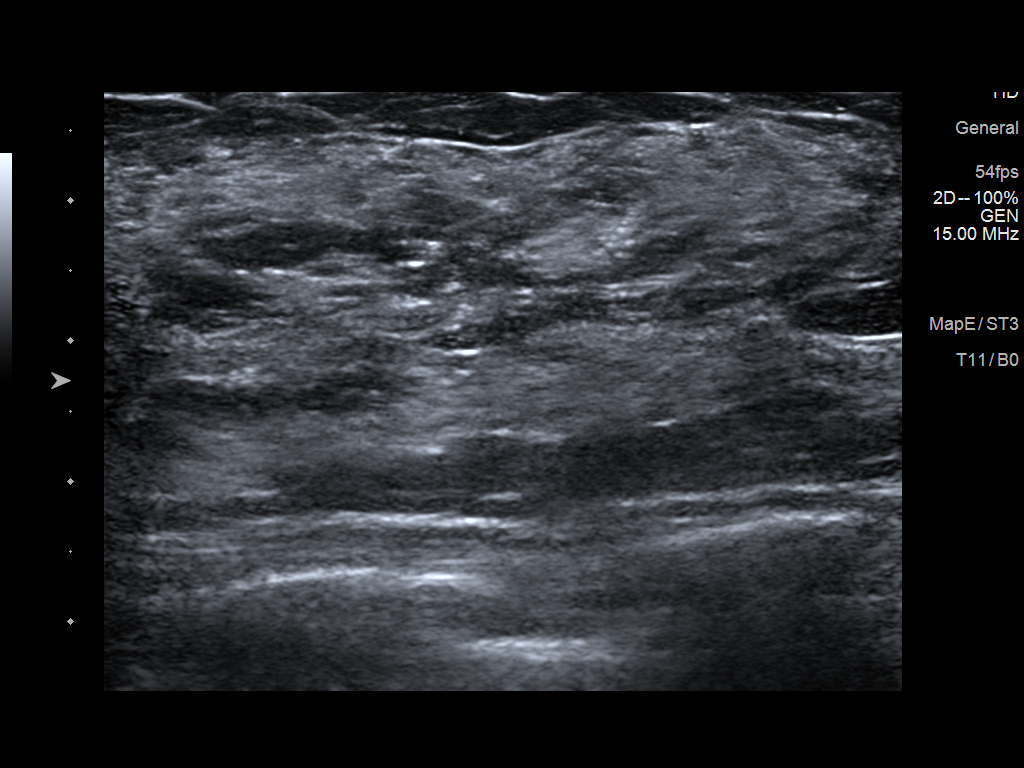
[im 2/6]
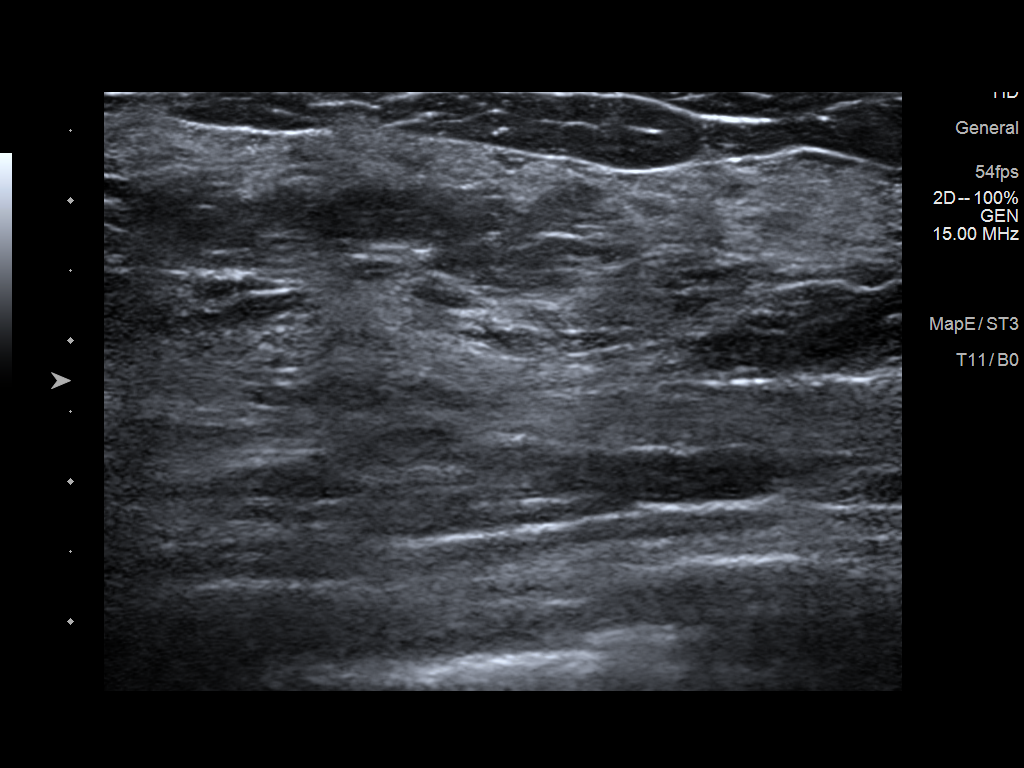
[im 3/6]
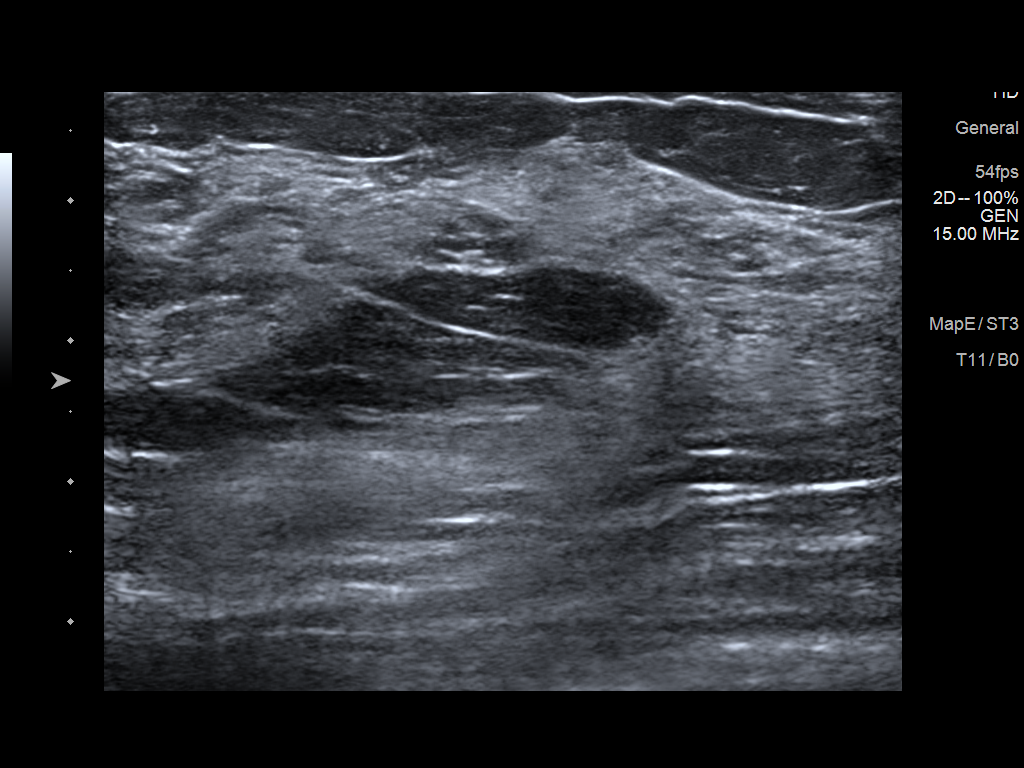
[im 4/6]
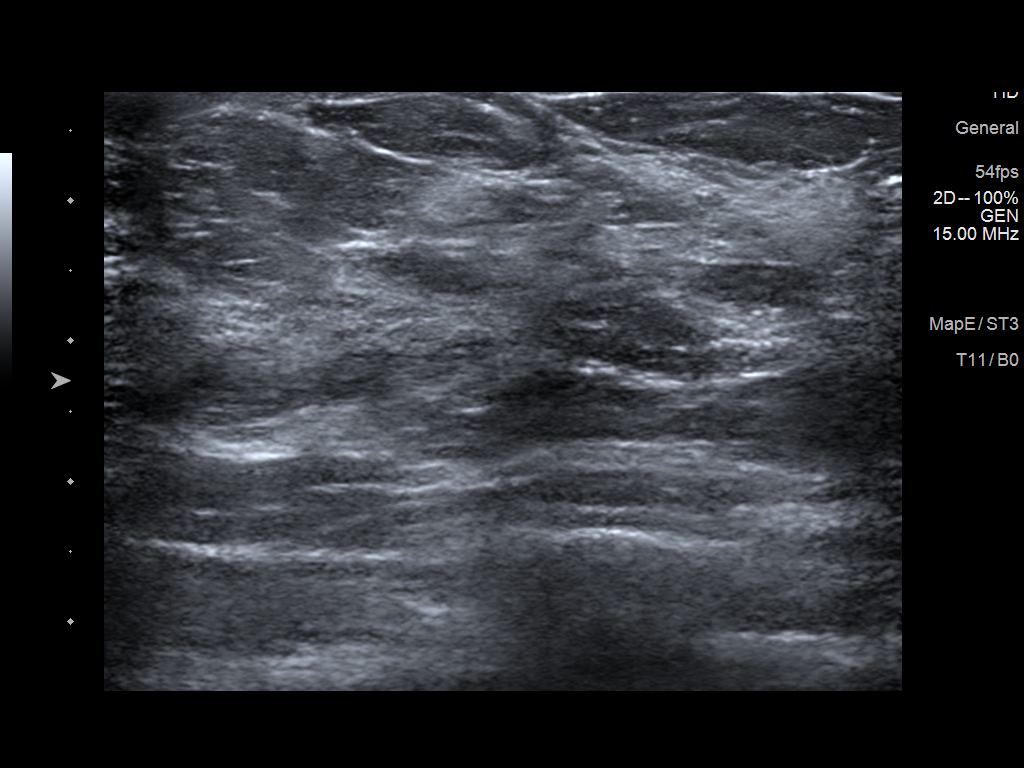
[im 5/6]
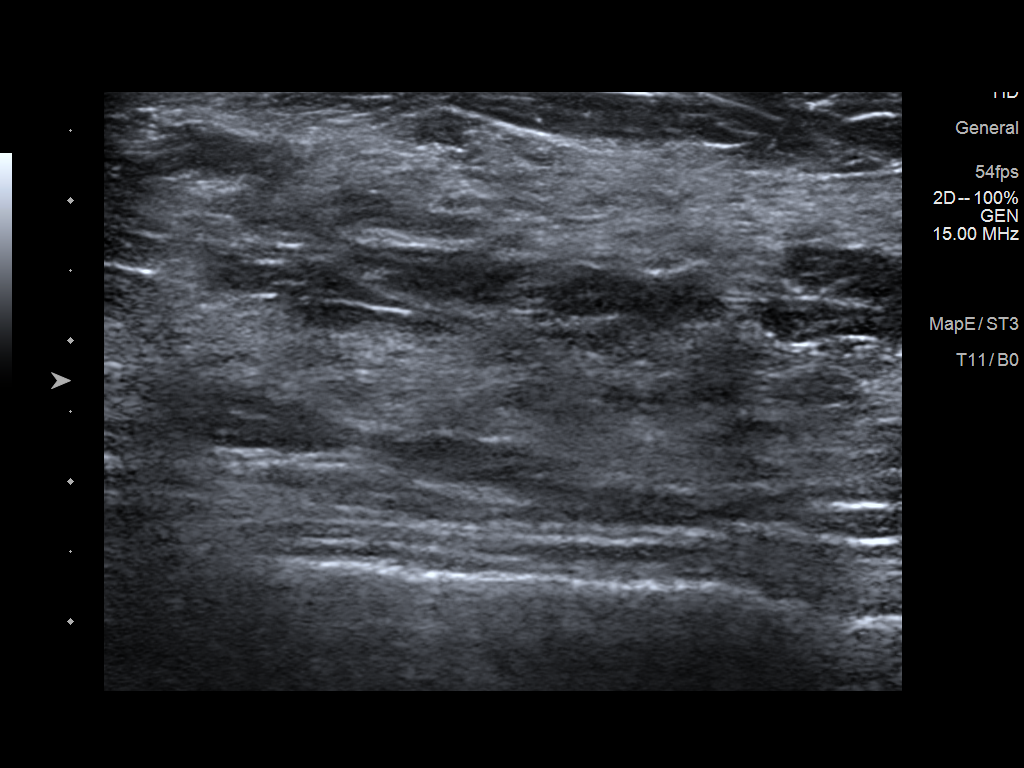
[im 6/6]
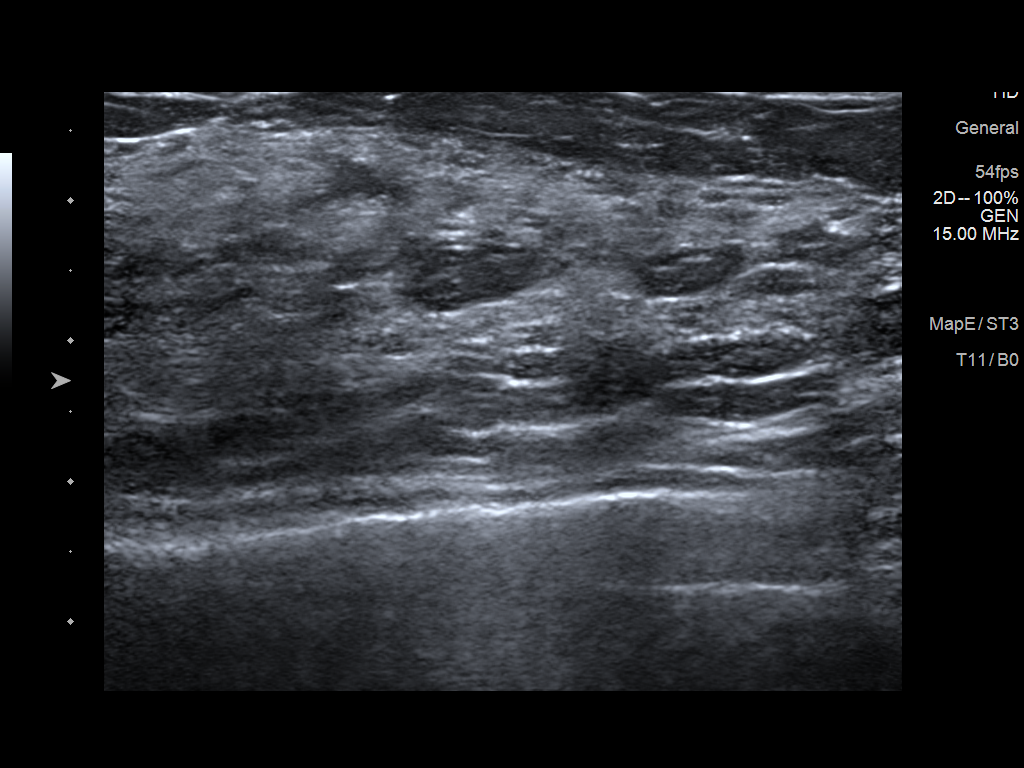

[6 of 6 positions shown; findings below may reference images not displayed]

ACR Breast Density Category c: The breast tissue is heterogeneously
dense, which may obscure small masses.
FINDINGS: Additional 2-D and 3-D images are performed. These views show dense
fibroglandular tissue in the LATERAL aspect of the LEFT breast. No
discrete mass identified.

Mammographic images were processed with CAD.

Targeted ultrasound is performed, showing dense fibroglandular
tissue in the LATERAL aspect of the LEFT breast. No suspicious mass,
distortion, or acoustic shadowing is demonstrated with ultrasound.
IMPRESSION: No mammographic or ultrasound evidence for malignancy.

RECOMMENDATION:
Screening mammogram in one year.(Code:[id number]-95B)

I have discussed the findings and recommendations with the patient.
If applicable, a reminder letter will be sent to the patient
regarding the next appointment.

BI-RADS CATEGORY  1: Negative.

## 2021-05-27 IMAGING — MG MM DIGITAL DIAGNOSTIC UNILAT*L* W/ TOMO W/ CAD
4 series · 4 of 12 positions shown · non-contrast
Comparison: Previous exam(s).

CLINICAL DATA: Patient returns after baseline screening study for
evaluation possible LEFT breast asymmetry.

EXAM:
DIGITAL DIAGNOSTIC LEFT MAMMOGRAM WITH CAD AND TOMO
ULTRASOUND LEFT BREAST

[L ML synth-2D]
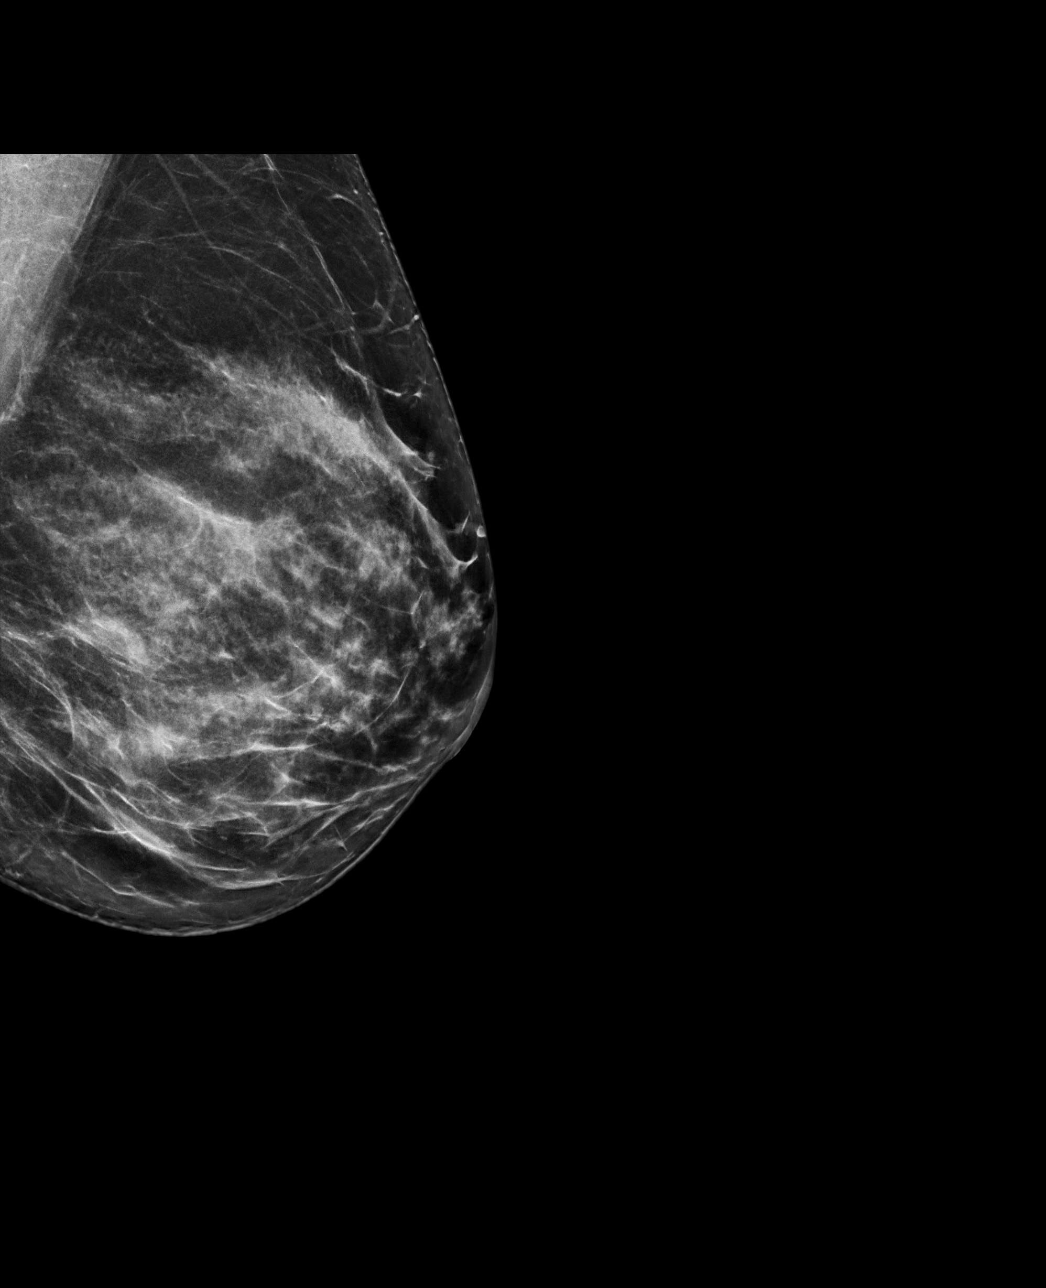

[L CC synth-2D]
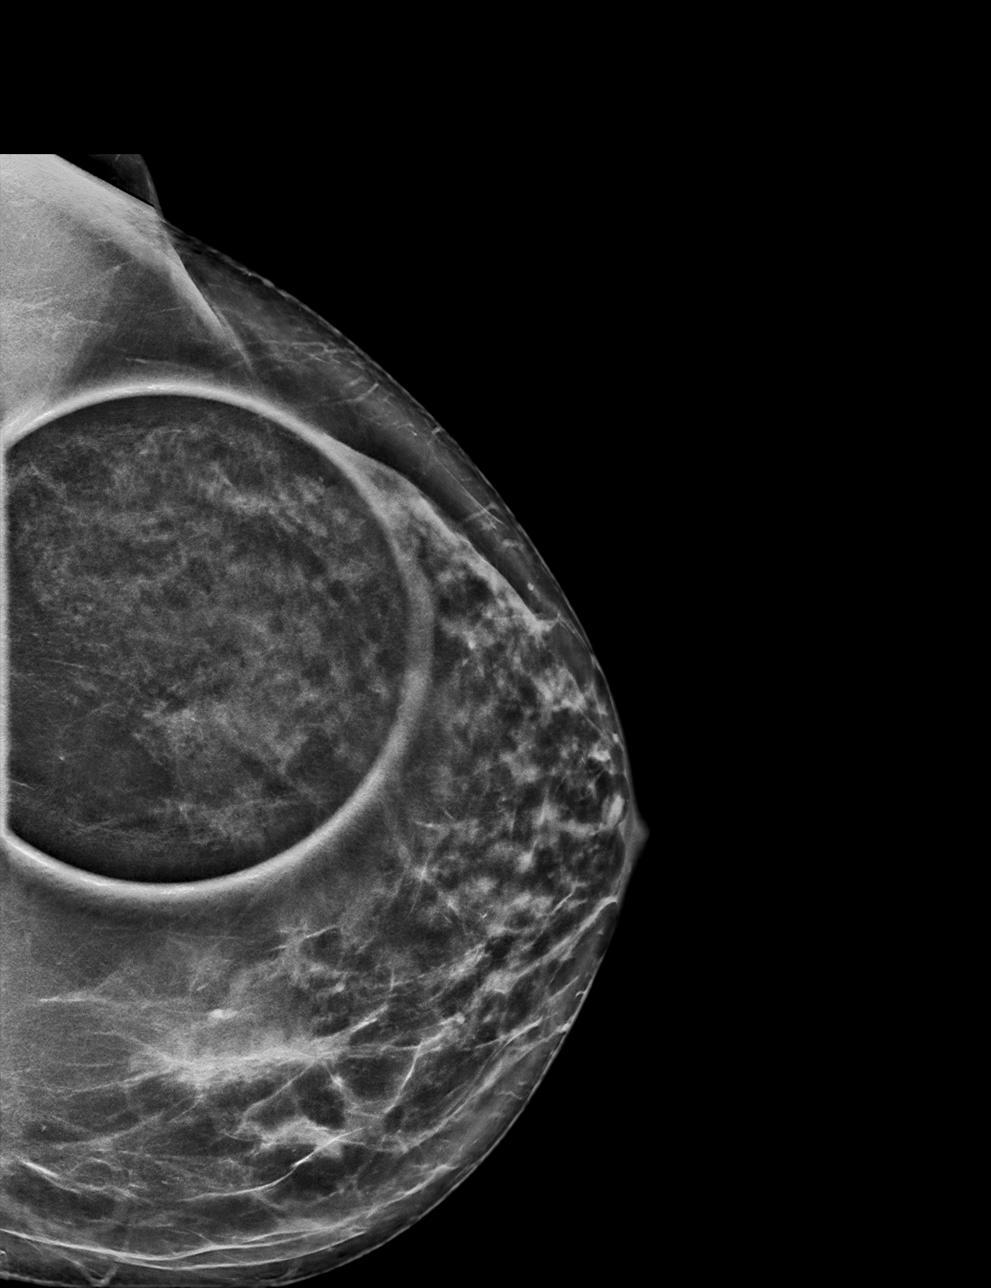

[L CC tomo · tomo slice 39/78.0]
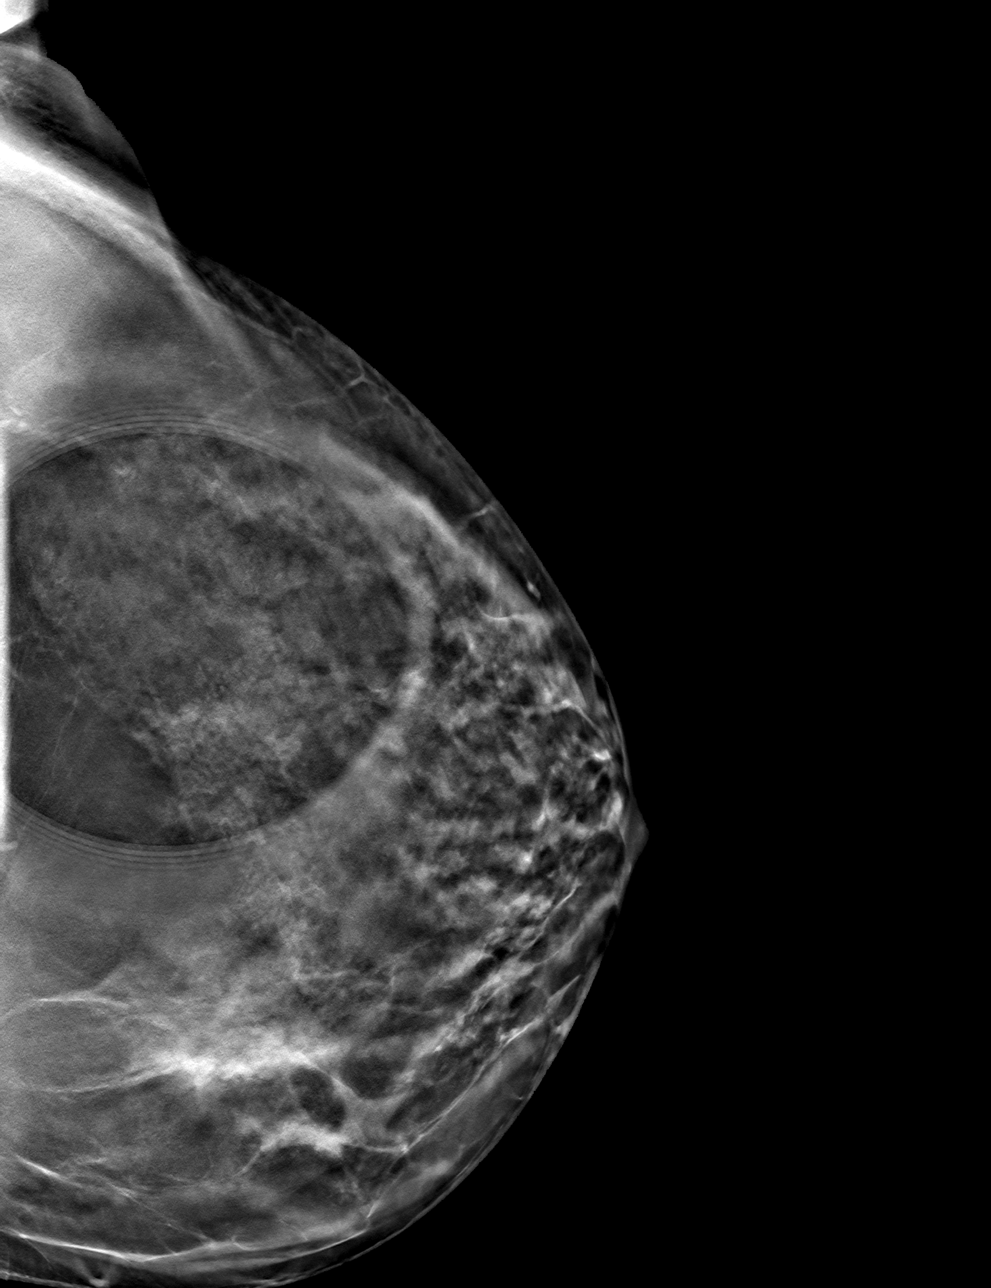

[L ML tomo · tomo slice 41/82.0]
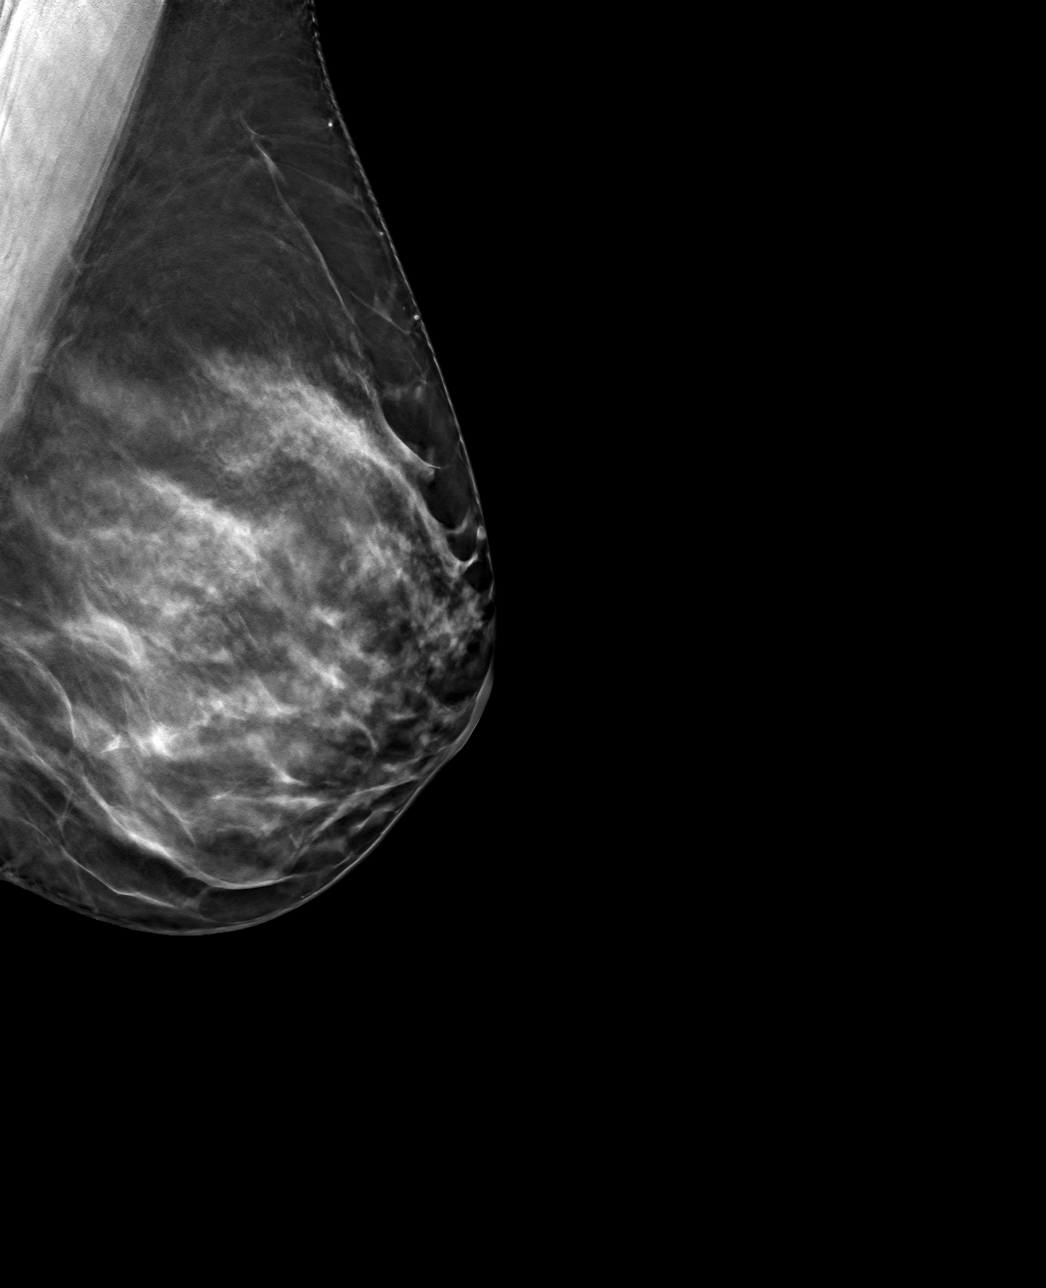

[4 of 12 positions shown; findings below may reference images not displayed]

ACR Breast Density Category c: The breast tissue is heterogeneously
dense, which may obscure small masses.
FINDINGS: Additional 2-D and 3-D images are performed. These views show dense
fibroglandular tissue in the LATERAL aspect of the LEFT breast. No
discrete mass identified.

Mammographic images were processed with CAD.

Targeted ultrasound is performed, showing dense fibroglandular
tissue in the LATERAL aspect of the LEFT breast. No suspicious mass,
distortion, or acoustic shadowing is demonstrated with ultrasound.
IMPRESSION: No mammographic or ultrasound evidence for malignancy.

RECOMMENDATION:
Screening mammogram in one year.(Code:[id number]-95B)

I have discussed the findings and recommendations with the patient.
If applicable, a reminder letter will be sent to the patient
regarding the next appointment.

BI-RADS CATEGORY  1: Negative.

## 2021-06-21 DIAGNOSIS — J3081 Allergic rhinitis due to animal (cat) (dog) hair and dander: Secondary | ICD-10-CM | POA: Diagnosis not present

## 2021-06-21 DIAGNOSIS — J3089 Other allergic rhinitis: Secondary | ICD-10-CM | POA: Diagnosis not present

## 2021-06-21 DIAGNOSIS — H1045 Other chronic allergic conjunctivitis: Secondary | ICD-10-CM | POA: Diagnosis not present

## 2021-06-21 DIAGNOSIS — J301 Allergic rhinitis due to pollen: Secondary | ICD-10-CM | POA: Diagnosis not present

## 2021-07-10 DIAGNOSIS — L814 Other melanin hyperpigmentation: Secondary | ICD-10-CM | POA: Diagnosis not present

## 2021-07-10 DIAGNOSIS — L700 Acne vulgaris: Secondary | ICD-10-CM | POA: Diagnosis not present

## 2021-07-10 DIAGNOSIS — L821 Other seborrheic keratosis: Secondary | ICD-10-CM | POA: Diagnosis not present

## 2021-07-10 DIAGNOSIS — D225 Melanocytic nevi of trunk: Secondary | ICD-10-CM | POA: Diagnosis not present

## 2021-07-10 DIAGNOSIS — Z23 Encounter for immunization: Secondary | ICD-10-CM | POA: Diagnosis not present

## 2021-07-10 DIAGNOSIS — L578 Other skin changes due to chronic exposure to nonionizing radiation: Secondary | ICD-10-CM | POA: Diagnosis not present

## 2021-08-09 DIAGNOSIS — E039 Hypothyroidism, unspecified: Secondary | ICD-10-CM | POA: Diagnosis not present

## 2021-08-09 DIAGNOSIS — R5383 Other fatigue: Secondary | ICD-10-CM | POA: Diagnosis not present

## 2021-08-09 DIAGNOSIS — R7303 Prediabetes: Secondary | ICD-10-CM | POA: Diagnosis not present

## 2021-08-09 DIAGNOSIS — L659 Nonscarring hair loss, unspecified: Secondary | ICD-10-CM | POA: Diagnosis not present

## 2021-08-10 DIAGNOSIS — L700 Acne vulgaris: Secondary | ICD-10-CM | POA: Diagnosis not present

## 2021-08-10 DIAGNOSIS — Z79899 Other long term (current) drug therapy: Secondary | ICD-10-CM | POA: Diagnosis not present

## 2021-09-26 DIAGNOSIS — E559 Vitamin D deficiency, unspecified: Secondary | ICD-10-CM | POA: Diagnosis not present

## 2021-09-26 DIAGNOSIS — R7303 Prediabetes: Secondary | ICD-10-CM | POA: Diagnosis not present

## 2021-09-26 DIAGNOSIS — E039 Hypothyroidism, unspecified: Secondary | ICD-10-CM | POA: Diagnosis not present

## 2021-09-26 DIAGNOSIS — K5090 Crohn's disease, unspecified, without complications: Secondary | ICD-10-CM | POA: Diagnosis not present

## 2021-09-26 DIAGNOSIS — R5383 Other fatigue: Secondary | ICD-10-CM | POA: Diagnosis not present

## 2021-09-26 DIAGNOSIS — L659 Nonscarring hair loss, unspecified: Secondary | ICD-10-CM | POA: Diagnosis not present

## 2021-10-11 DIAGNOSIS — K5090 Crohn's disease, unspecified, without complications: Secondary | ICD-10-CM | POA: Diagnosis not present

## 2021-10-11 DIAGNOSIS — R7303 Prediabetes: Secondary | ICD-10-CM | POA: Diagnosis not present

## 2021-10-11 DIAGNOSIS — E039 Hypothyroidism, unspecified: Secondary | ICD-10-CM | POA: Diagnosis not present

## 2021-10-11 DIAGNOSIS — R5383 Other fatigue: Secondary | ICD-10-CM | POA: Diagnosis not present

## 2021-12-12 ENCOUNTER — Encounter: Payer: Self-pay | Admitting: Family Medicine

## 2021-12-12 ENCOUNTER — Ambulatory Visit (INDEPENDENT_AMBULATORY_CARE_PROVIDER_SITE_OTHER): Payer: BC Managed Care – PPO | Admitting: Family Medicine

## 2021-12-12 VITALS — BP 133/81 | HR 60 | Temp 98.0°F | Ht 66.0 in | Wt 178.8 lb

## 2021-12-12 DIAGNOSIS — G5603 Carpal tunnel syndrome, bilateral upper limbs: Secondary | ICD-10-CM

## 2021-12-12 DIAGNOSIS — M546 Pain in thoracic spine: Secondary | ICD-10-CM

## 2021-12-12 MED ORDER — CYCLOBENZAPRINE HCL 10 MG PO TABS: 10.0000 mg | ORAL_TABLET | Freq: Every evening | ORAL | 0 refills | Status: DC | PRN

## 2021-12-12 NOTE — Patient Instructions (Addendum)
Please return in 3 months for your annual complete physical; please come fasting.  ? ?Take advil 2 twice a day for a week.  ?Purchase carpal tunnel splints and wear nighlty for several weeks. ?Watch your posture! ?Strengthen your core and small muscles of the back. ? ?If you have any questions or concerns, please don't hesitate to send me a message via MyChart or call the office at [phone]. Thank you for visiting with [place] today! It's our pleasure caring for you.  ? ?Carpal Tunnel Syndrome ?Carpal tunnel syndrome is a condition that causes pain, numbness, and weakness in your hand and fingers. The carpal tunnel is a narrow area located on the palm side of your wrist. Repeated wrist motion or certain diseases may cause swelling within the tunnel. This swelling pinches the main nerve in the wrist. The main nerve in the wrist is called the median nerve. ?What are the causes? ?This condition may be caused by: ?Repeated and forceful wrist and hand motions. ?Wrist injuries. ?Arthritis. ?A cyst or tumor in the carpal tunnel. ?Fluid buildup during pregnancy. ?Use of tools that vibrate. ?Sometimes the cause of this condition is not known. ?What increases the risk? ?The following factors may make you more likely to develop this condition: ?Having a job that requires you to repeatedly or forcefully move your wrist or hand or requires you to use tools that vibrate. This may include jobs that involve using computers, working on an [employer], or working with [place] such as Pension scheme manager. ?Being a woman. ?Having certain conditions, such as: ?Diabetes. ?Obesity. ?An underactive thyroid (hypothyroidism). ?Kidney failure. ?Rheumatoid arthritis. ?What are the signs or symptoms? ?Symptoms of this condition include: ?A tingling feeling in your fingers, especially in your thumb, index, and middle fingers. ?Tingling or numbness in your hand. ?An aching feeling in your entire arm, especially when your wrist and elbow are  bent for a long time. ?Wrist pain that goes up your arm to your shoulder. ?Pain that goes down into your palm or fingers. ?A weak feeling in your hands. You may have trouble grabbing and holding items. ?Your symptoms may feel worse during the night. ?How is this diagnosed? ?This condition is diagnosed with a medical history and physical exam. You may also have tests, including: ?Electromyogram (EMG). This test measures electrical signals sent by your nerves into the muscles. ?Nerve conduction study. This test measures how well electrical signals pass through your nerves. ?Imaging tests, such as X-rays, ultrasound, and MRI. These tests check for possible causes of your condition. ?How is this treated? ?This condition may be treated with: ?Lifestyle changes. It is important to stop or change the activity that caused your condition. ?Doing exercise and activities to strengthen and stretch your muscles and tendons (physical therapy). ?Making lifestyle changes to help with your condition and learning how to do your daily activities safely (occupational therapy). ?Medicines for pain and inflammation. This may include medicine that is injected into your wrist. ?A wrist splint or brace. ?Surgery. ?Follow these instructions at home: ?If you have a splint or brace: ?Wear the splint or brace as told by your health care provider. Remove it only as told by your health care provider. ?Loosen the splint or brace if your fingers tingle, become numb, or turn cold and blue. ?Keep the splint or brace clean. ?If the splint or brace is not waterproof: ?Do not let it get wet. ?Cover it with a watertight covering when you take a bath or shower. ?Managing  pain, stiffness, and swelling ?If directed, put ice on the painful area. To do this: ?If you have a removeable splint or brace, remove it as told by your health care provider. ?Put ice in a plastic bag. ?Place a towel between your skin and the bag or between the splint or brace and the  bag. ?Leave the ice on for 20 minutes, 2-3 times a day. Do not fall asleep with the cold pack on your skin. ?Remove the ice if your skin turns bright red. This is very important. If you cannot feel pain, heat, or cold, you have a greater risk of damage to the area. ?Move your fingers often to reduce stiffness and swelling. ?General instructions ?Take over-the-counter and prescription medicines only as told by your health care provider. ?Rest your wrist and hand from any activity that may be causing your pain. If your condition is work related, talk with your employer about changes that can be made, such as getting a wrist pad to use while typing. ?Do any exercises as told by your health care provider, physical therapist, or occupational therapist. ?Keep all follow-up visits. This is important. ?Contact a health care provider if: ?You have new symptoms. ?Your pain is not controlled with medicines. ?Your symptoms get worse. ?Get help right away if: ?You have severe numbness or tingling in your wrist or hand. ?Summary ?Carpal tunnel syndrome is a condition that causes pain, numbness, and weakness in your hand and fingers. ?It is usually caused by repeated wrist motions. ?Lifestyle changes and medicines are used to treat carpal tunnel syndrome. Surgery may be recommended. ?Follow your health care provider's instructions about wearing a splint, resting from activity, keeping follow-up visits, and calling for help. ?This information is not intended to replace advice given to you by your health care provider. Make sure you discuss any questions you have with your health care provider. ?Document Revised: 01/20/2020 Document Reviewed: 01/20/2020 ?Elsevier Patient Education ? . ? ?

## 2021-12-12 NOTE — Progress Notes (Signed)
? ?Subjective  ?CC:  ?Chief Complaint  ?Patient presents with  ? Joint Pain  ?  Complains of Back, hip, knee and ankle soreness but has since subsided.  ? Numbness  ?   ?  ? Back Pain  ?  States when having a massage a couple of weeks ago, they noticed a "raise" on her back, that she says that she cannot see, but can feel.    ? ? ?HPI: Shannon Singh is a 41 y.o. female who presents to the office today to address the problems listed above in the chief complaint. 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 Pain is worse after a long day in the car. Feels aching and tightness. Tries to "stretch it out". No radiation of pain. No neck pain but can have upper back pain as well: traps. No radicular sxs. No low back pain. No injury. Hasn't taken any meds; did get a massage.  ?Also with bilateral hand numbnes and tingling: night and while driving at times.  No weakness.  ?Ros: no fevers, joint pains, weakness, rash ? ? ?Assessment  ?1. Acute midline thoracic back pain   ?2. Bilateral carpal tunnel syndrome   ? ?  ?Plan  ?Thoracic back pain:  msk pain. Discussed posture changes, lumbar support while driving, core strengthening and short course of advil and mm relaxer. No red flag sxs ?CTS: education. Advil, wrist splints and f/u if not improving. See avs. ? ?Follow up: 3 mo for cpe  ?Visit date not found ? ?No orders of the defined types were placed in this encounter. ? ?No orders of the defined types were placed in this encounter. ? ?  ? ?I reviewed the patients updated PMH, FH, and SocHx.  ?  ?Patient Active Problem List  ? Diagnosis Date Noted  ? Oral contraceptive use 08/28/2018  ? Major depression, recurrent, chronic ([place]) 11/28/2017  ? Acne 11/28/2017  ? History of systemic steroid therapy 07/26/2013  ? Crohn's ileocolitis ([place]) 06/03/2012  ? Migraine headache 10/03/2007  ? ?Current Meds  ?Medication Sig  ? escitalopram (LEXAPRO) 10  MG tablet Take 10 mg by mouth every morning.  ? levocetirizine (XYZAL) 2.5 MG/5ML solution Take 2.5 mg by mouth every evening. For allergies  ? mesalamine (LIALDA) 1.2 g EC tablet Take 2 tablets (2.4 g total) by mouth 2 (two) times daily.  ? montelukast (SINGULAIR) 10 MG tablet Take 10 mg by mouth at bedtime.  ? NP THYROID 30 MG tablet Take 30 mg by mouth every morning.  ? Probiotic Product (PROBIOTIC DAILY PO) Take 1 tablet by mouth daily. Pt takes, Probiotic of [place] brand  ? spironolactone (ALDACTONE) 50 MG tablet Take 50 mg by mouth daily.  ? ?Current Facility-Administered Medications for the 12/12/21 encounter (Office Visit) with Leamon Arnt, MD  ?Medication  ? 0.9 %  sodium chloride infusion  ? ? ?Allergies: ?Patient has No Known Allergies. ?Family History: ?Patient family history includes Colon polyps in her father; Healthy in her daughter and son; Heart disease in her maternal grandmother; Prostate cancer in her maternal grandfather. ?Social History:  ?Patient  reports that she has never smoked. She has never used smokeless tobacco. She reports current alcohol use. She reports that she does not use drugs. ? ?Review of Systems: ?Constitutional: Negative for fever malaise or anorexia ?Cardiovascular: negative for chest pain ?Respiratory: negative for SOB or persistent  cough ?Gastrointestinal: negative for abdominal pain ? ?Objective  ?Vitals: BP 133/81   Pulse 60   Temp 98 ?F (36.7 ?C) (Temporal)   Ht 5' 6"  (1.676 m)   Wt 178 lb 12.8 oz (81.1 kg)   SpO2 99%   BMI 28.86 kg/m?  ?General: no acute distress , A&Ox3 ?Back: nontender. No mm spasm or mass. Non tender spine.  ?Tender traps, supple neck ?Wrists: nl rom. + phalens' bilaterall. Nl grip ? ? ? ?Commons side effects, risks, benefits, and alternatives for medications and treatment plan prescribed today were discussed, and the patient expressed understanding of the given instructions. Patient is instructed to call or message via MyChart if he/she  has any questions or concerns regarding our treatment plan. No barriers to understanding were identified. We discussed Red Flag symptoms and signs in detail. Patient expressed understanding regarding what to do in case of urgent or emergency type symptoms.  ?Medication list was reconciled, printed and provided to the patient in AVS. Patient instructions and summary information was reviewed with the patient as documented in the AVS. ?This note was prepared with assistance of Systems analyst. Occasional wrong-word or sound-a-like substitutions may have occurred due to the inherent limitations of voice recognition software ? ?This visit occurred during the SARS-CoV-2 public health emergency.  Safety protocols were in place, including screening questions prior to the visit, additional usage of staff PPE, and extensive cleaning of exam room while observing appropriate contact time as indicated for disinfecting solutions.  ? ?

## 2022-01-18 DIAGNOSIS — E039 Hypothyroidism, unspecified: Secondary | ICD-10-CM | POA: Diagnosis not present

## 2022-01-18 DIAGNOSIS — L659 Nonscarring hair loss, unspecified: Secondary | ICD-10-CM | POA: Diagnosis not present

## 2022-01-18 DIAGNOSIS — E349 Endocrine disorder, unspecified: Secondary | ICD-10-CM | POA: Diagnosis not present

## 2022-01-18 DIAGNOSIS — N943 Premenstrual tension syndrome: Secondary | ICD-10-CM | POA: Diagnosis not present

## 2022-02-07 DIAGNOSIS — N943 Premenstrual tension syndrome: Secondary | ICD-10-CM | POA: Diagnosis not present

## 2022-02-07 DIAGNOSIS — E559 Vitamin D deficiency, unspecified: Secondary | ICD-10-CM | POA: Diagnosis not present

## 2022-02-07 DIAGNOSIS — E538 Deficiency of other specified B group vitamins: Secondary | ICD-10-CM | POA: Diagnosis not present

## 2022-02-07 DIAGNOSIS — E349 Endocrine disorder, unspecified: Secondary | ICD-10-CM | POA: Diagnosis not present

## 2022-02-07 DIAGNOSIS — E7800 Pure hypercholesterolemia, unspecified: Secondary | ICD-10-CM | POA: Diagnosis not present

## 2022-02-07 DIAGNOSIS — R7303 Prediabetes: Secondary | ICD-10-CM | POA: Diagnosis not present

## 2022-02-07 DIAGNOSIS — E039 Hypothyroidism, unspecified: Secondary | ICD-10-CM | POA: Diagnosis not present

## 2022-02-07 DIAGNOSIS — L659 Nonscarring hair loss, unspecified: Secondary | ICD-10-CM | POA: Diagnosis not present

## 2022-02-12 ENCOUNTER — Encounter: Payer: Self-pay | Admitting: Internal Medicine

## 2022-03-01 DIAGNOSIS — E538 Deficiency of other specified B group vitamins: Secondary | ICD-10-CM | POA: Diagnosis not present

## 2022-03-01 DIAGNOSIS — L659 Nonscarring hair loss, unspecified: Secondary | ICD-10-CM | POA: Diagnosis not present

## 2022-03-01 DIAGNOSIS — E349 Endocrine disorder, unspecified: Secondary | ICD-10-CM | POA: Diagnosis not present

## 2022-03-01 DIAGNOSIS — E039 Hypothyroidism, unspecified: Secondary | ICD-10-CM | POA: Diagnosis not present

## 2022-03-29 ENCOUNTER — Encounter: Payer: BC Managed Care – PPO | Admitting: Family Medicine

## 2022-06-04 ENCOUNTER — Encounter: Payer: Self-pay | Admitting: Family Medicine

## 2022-06-04 ENCOUNTER — Ambulatory Visit (INDEPENDENT_AMBULATORY_CARE_PROVIDER_SITE_OTHER): Payer: BC Managed Care – PPO | Admitting: Family Medicine

## 2022-06-04 VITALS — BP 104/80 | HR 60 | Temp 98.6°F | Ht 66.0 in | Wt 178.4 lb

## 2022-06-04 DIAGNOSIS — M67911 Unspecified disorder of synovium and tendon, right shoulder: Secondary | ICD-10-CM | POA: Diagnosis not present

## 2022-06-04 DIAGNOSIS — R2232 Localized swelling, mass and lump, left upper limb: Secondary | ICD-10-CM | POA: Diagnosis not present

## 2022-06-04 DIAGNOSIS — Z23 Encounter for immunization: Secondary | ICD-10-CM

## 2022-06-04 NOTE — Progress Notes (Signed)
Subjective  CC:  Chief Complaint  Patient presents with   Mass    Pt has a lump on her Lt forearm but not painful just tender    HPI: Shannon Singh is a 41 y.o. female who presents to the office today to address the problems listed above in the chief complaint. 41 year old female with Crohn's disease noticed a mass on her left forearm.  Noticed just yesterday.  There is a small discoloration in the forearm that she thought was a bruise.,  Rubbing she noted a tender area proximal to that.  No limitation of function.  No weakness.  No known trauma.  She has been playing volleyball with her daughter but does not have been is related.  No numbness or tingling.  No redness or warmth. Right shoulder: Complains of several month history of right shoulder pain.  Worse with certain movements.  No trauma.  He has not done anything for the pain.  No neck symptoms.  No radicular symptoms.  RightAssessment  1. Mass of left forearm   2. Rotator cuff disorder, right   3. Need for influenza vaccination      Plan  Mass of left forearm: Seems to be within the muscle.  Unclear etiology, may be related to muscular trauma, cystic lesion or other.  To start, would like an ultrasound so we will refer to sports medicine for them to take a look..  Imaging came back be ordered if needed. Likely rotator cuff tendinopathy or bursitis of the right shoulder.  Discussed range of motion exercises, RICE therapy and follow-up if not improving. Patient updated today.  Follow up:  for cpe Visit date not found  Orders Placed This Encounter  Procedures   Flu Vaccine QUAD 6+ mos PF IM (Fluarix Quad PF)   Ambulatory referral to Sports Medicine   No orders of the defined types were placed in this encounter.     I reviewed the patients updated PMH, FH, and SocHx.    Patient Active Problem List   Diagnosis Date Noted   Oral contraceptive use 08/28/2018   Major depression, recurrent, chronic ([place]) 11/28/2017   Acne  11/28/2017   History of systemic steroid therapy 07/26/2013   Crohn's ileocolitis ([place]) 06/03/2012   Migraine headache 10/03/2007   Current Meds  Medication Sig   levocetirizine (XYZAL) 2.5 MG/5ML solution Take 2.5 mg by mouth every evening. For allergies   mesalamine (LIALDA) 1.2 g EC tablet Take 2 tablets (2.4 g total) by mouth 2 (two) times daily.   montelukast (SINGULAIR) 10 MG tablet Take 10 mg by mouth at bedtime.   Probiotic Product (PROBIOTIC DAILY PO) Take 1 tablet by mouth daily. Pt takes, Probiotic of America brand   spironolactone (ALDACTONE) 50 MG tablet Take 50 mg by mouth daily.   Current Facility-Administered Medications for the 06/04/22 encounter (Office Visit) with Leamon Arnt, MD  Medication   0.9 %  sodium chloride infusion    Allergies: Patient has No Known Allergies. Family History: Patient family history includes Colon polyps in her father; Healthy in her daughter and son; Heart disease in her maternal grandmother; Prostate cancer in her maternal grandfather. Social History:  Patient  reports that she has never smoked. She has never used smokeless tobacco. She reports current alcohol use. She reports that she does not use drugs.  Review of Systems: Constitutional: Negative for fever malaise or anorexia Cardiovascular: negative for chest pain Respiratory: negative for SOB or persistent cough Gastrointestinal: negative for  abdominal pain  Objective  Vitals: BP 104/80   Pulse 60   Temp 98.6 F (37 C)   Ht 5' 6"  (1.676 m)   Wt 178 lb 6.4 oz (80.9 kg)   SpO2 98%   BMI 28.79 kg/m  General: no acute distress , A&Ox3 Left elbow: Nontender epicondyles, normal creatinine, no erythema warmth or redness.  Just distally is 1 to 2 cm mobile mildly tender mass located in the proximal belly of the forearm muscles.  No erythema warmth or redness. Right shoulder: Full range of motion, tender subacromial bursa, positive decant testing, negative  impingement   Commons side effects, risks, benefits, and alternatives for medications and treatment plan prescribed today were discussed, and the patient expressed understanding of the given instructions. Patient is instructed to call or message via MyChart if he/she has any questions or concerns regarding our treatment plan. No barriers to understanding were identified. We discussed Red Flag symptoms and signs in detail. Patient expressed understanding regarding what to do in case of urgent or emergency type symptoms.  Medication list was reconciled, printed and provided to the patient in AVS. Patient instructions and summary information was reviewed with the patient as documented in the AVS. This note was prepared with assistance of Dragon voice recognition software. Occasional wrong-word or sound-a-like substitutions may have occurred due to the inherent limitations of voice recognition software  This visit occurred during the SARS-CoV-2 public health emergency.  Safety protocols were in place, including screening questions prior to the visit, additional usage of staff PPE, and extensive cleaning of exam room while observing appropriate contact time as indicated for disinfecting solutions.

## 2022-06-04 NOTE — Patient Instructions (Signed)
Please return for your annual complete physical; please come fasting.   We will call you to get you in with sports medicine. I believe they will be able to ultrasound the area to give you some guidance and see if anything further is needed like another imaging study.  If you have any questions or concerns, please don't hesitate to send me a message via MyChart or call the office at [phone]. Thank you for visiting with [place] today! It's our pleasure caring for you.   Rotator Cuff Tendinitis  Rotator cuff tendinitis is inflammation of the tendons in the rotator cuff. Tendons are tough, cord-like bands that connect muscle to bone. The rotator cuff includes all of the muscles and tendons that connect the arm to the shoulder. The rotator cuff holds the head of the humerus, or the upper arm bone, in the cup of the shoulder blade (scapula). This condition can lead to a long-term or chronic tear. The tear may be partial or complete. What are the causes? This condition is usually caused by overusing the rotator cuff. What increases the risk? This condition is more likely to develop in athletes and workers who frequently use their shoulder or reach over their heads. This can include activities such as: Tennis. Baseball or softball. Swimming. Construction work. Painting. What are the signs or symptoms? Symptoms of this condition include: Pain that spreads (radiates) from the shoulder to the upper arm. Swelling and tenderness in front of the shoulder. Pain when reaching, pulling, or lifting the arm above the head. Pain when lowering the arm from above the head. Minor pain in the shoulder when resting. Increased pain in the shoulder at night. Difficulty placing the arm behind the back. How is this diagnosed? This condition is diagnosed with a physical exam and medical history. Tests may also be done, including: X-rays. MRI. Ultrasound. CT with or without contrast. How is this  treated? Treatment for this condition depends on the severity of the condition. In less severe cases, treatment may include: Rest. This may be done with a sling that holds the shoulder still (immobilization). Your health care provider may also recommend avoiding activities that involve lifting your arm over your head. Icing the shoulder. Anti-inflammatory medicines, such as aspirin or ibuprofen. In more severe cases, treatment may include: Physical therapy. Steroid injections. Surgery. Follow these instructions at home: If you have a sling: Wear the sling as told by your health care provider. Remove it only as told by your health care provider. Loosen it if your fingers tingle, become numb, or turn cold and blue. Keep it clean. If the sling is not waterproof: Do not let it get wet. Cover it with a watertight covering when you take a bath or shower. Managing pain, stiffness, and swelling  If directed, put ice on the injured area. To do this: If you have a removable sling, remove it as told by your health care provider. Put ice in a plastic bag. Place a towel between your skin and the bag. Leave the ice on for 20 minutes, 2-3 times a day. Move your fingers often to reduce stiffness and swelling. Raise (elevate) the injured area above the level of your heart while you are lying down. Find a comfortable sleeping position, or sleep in a recliner, if available. Activity Rest your shoulder as told by your health care provider. Ask your health care provider when it is safe to drive if you have a sling on your arm. Return to your normal activities  as told by your health care provider. Ask your health care provider what activities are safe for you. Do any exercises or stretches as told by your health care provider or physical therapist. If you do repetitive overhead tasks, take small breaks in between and include stretching exercises as told by your health care provider. General  instructions Do not use any products that contain nicotine or tobacco, such as cigarettes, e-cigarettes, and chewing tobacco. These can delay healing. If you need help quitting, ask your health care provider. Take over-the-counter and prescription medicines only as told by your health care provider. Keep all follow-up visits as told by your health care provider. This is important. Contact a health care provider if: Your pain gets worse. You have new pain in your arm, hands, or fingers. Your pain is not relieved with medicine or does not get better after 6 weeks of treatment. You have crackling sensations when moving your shoulder in certain directions. You hear a snapping sound after using your shoulder, followed by severe pain and weakness. Get help right away if: Your arm, hand, or fingers are numb or tingling. Your arm, hand, or fingers are swollen or painful or they turn white or blue. Summary Rotator cuff tendinitis is inflammation of the tendons in the rotator cuff. Tendons are tough, cord-like bands that connect muscle to bone. This condition is usually caused by overusing the rotator cuff, which includes all of the muscles and tendons that connect the arm to the shoulder. This condition is more likely to develop in athletes and workers who frequently use their shoulder or reach over their heads. Treatment generally includes rest, anti-inflammatory medicines, and icing. In some cases, physical therapy and steroid injections may be needed. In severe cases, surgery may be needed. This information is not intended to replace advice given to you by your health care provider. Make sure you discuss any questions you have with your health care provider. Document Revised: 06/14/2019 Document Reviewed: 06/14/2019 Elsevier Patient Education  [place].

## 2022-06-04 NOTE — Progress Notes (Unsigned)
   I, Peterson Lombard, LAT, ATC acting as a scribe for Lynne Leader, MD.  Subjective:    CC: L forearm pain  HPI: Pt is a 41 y/o female c/o L forearm pain. Pt was previously seen by Dr. Georgina Snell on 2020 for R elbow pain.  Today, pt c/o L forearm pain ongoing since 9/11. Pt had been playing volleyball w/ her daughter but feels this pain is unrelated. Pt locates tenderness and lump to   Pertinent review of Systems: ***  Relevant historical information: ***   Objective:   There were no vitals filed for this visit. General: Well Developed, well nourished, and in no acute distress.   MSK: ***  Lab and Radiology Results No results found for this or any previous visit (from the past 72 hour(s)). No results found.    Impression and Recommendations:    Assessment and Plan: 41 y.o. female with ***.  PDMP not reviewed this encounter. No orders of the defined types were placed in this encounter.  No orders of the defined types were placed in this encounter.   Discussed warning signs or symptoms. Please see discharge instructions. Patient expresses understanding.   ***

## 2022-06-05 ENCOUNTER — Ambulatory Visit (INDEPENDENT_AMBULATORY_CARE_PROVIDER_SITE_OTHER): Payer: BC Managed Care – PPO

## 2022-06-05 ENCOUNTER — Ambulatory Visit (INDEPENDENT_AMBULATORY_CARE_PROVIDER_SITE_OTHER): Payer: BC Managed Care – PPO | Admitting: Family Medicine

## 2022-06-05 ENCOUNTER — Ambulatory Visit: Payer: Self-pay

## 2022-06-05 VITALS — BP 130/82 | HR 56 | Ht 66.0 in | Wt 179.2 lb

## 2022-06-05 DIAGNOSIS — R2232 Localized swelling, mass and lump, left upper limb: Secondary | ICD-10-CM | POA: Diagnosis not present

## 2022-06-05 NOTE — Patient Instructions (Signed)
Thank you for coming in today.   Please get an Xray today before you leave   If not improving with time we can do an MRI relatively quickly.   Give it 2 weeks. If not better or if worsening let me know and we do a MRI.   I think this is a contusion or bruise.

## 2022-06-06 ENCOUNTER — Other Ambulatory Visit: Payer: Self-pay

## 2022-06-06 ENCOUNTER — Encounter: Payer: Self-pay | Admitting: Family Medicine

## 2022-06-06 DIAGNOSIS — R2232 Localized swelling, mass and lump, left upper limb: Secondary | ICD-10-CM

## 2022-06-06 NOTE — Progress Notes (Signed)
Left elbow x-ray looks normal to radiology.

## 2022-06-09 ENCOUNTER — Ambulatory Visit (INDEPENDENT_AMBULATORY_CARE_PROVIDER_SITE_OTHER): Payer: BC Managed Care – PPO

## 2022-06-09 DIAGNOSIS — R2232 Localized swelling, mass and lump, left upper limb: Secondary | ICD-10-CM

## 2022-06-09 DIAGNOSIS — M7989 Other specified soft tissue disorders: Secondary | ICD-10-CM | POA: Diagnosis not present

## 2022-06-11 NOTE — Progress Notes (Signed)
MRI of the elbow did see the soft tissue mass and it looks like a bruise or hematoma.  They recommend following along for resolution over the next 6 weeks.  Recommend recheck in clinic with me in about 6 weeks to go over the results in full detail and also to check on it again.

## 2022-07-12 DIAGNOSIS — E559 Vitamin D deficiency, unspecified: Secondary | ICD-10-CM | POA: Diagnosis not present

## 2022-07-12 DIAGNOSIS — E785 Hyperlipidemia, unspecified: Secondary | ICD-10-CM | POA: Diagnosis not present

## 2022-07-12 DIAGNOSIS — E039 Hypothyroidism, unspecified: Secondary | ICD-10-CM | POA: Diagnosis not present

## 2022-07-12 DIAGNOSIS — R7303 Prediabetes: Secondary | ICD-10-CM | POA: Diagnosis not present

## 2022-07-19 ENCOUNTER — Other Ambulatory Visit: Payer: Self-pay | Admitting: Internal Medicine

## 2022-07-19 NOTE — Telephone Encounter (Signed)
I left her a detailed message to see if she needed a refill [facility] as [facility] refill went to [facility]. I also told her she needs to make an appointment. [facility]

## 2022-07-21 DIAGNOSIS — Z6829 Body mass index (BMI) 29.0-29.9, adult: Secondary | ICD-10-CM | POA: Diagnosis not present

## 2022-07-21 DIAGNOSIS — J0140 Acute pansinusitis, unspecified: Secondary | ICD-10-CM | POA: Diagnosis not present

## 2022-07-21 DIAGNOSIS — H66003 Acute suppurative otitis media without spontaneous rupture of ear drum, bilateral: Secondary | ICD-10-CM | POA: Diagnosis not present

## 2022-07-21 DIAGNOSIS — R051 Acute cough: Secondary | ICD-10-CM | POA: Diagnosis not present

## 2022-09-04 DIAGNOSIS — E559 Vitamin D deficiency, unspecified: Secondary | ICD-10-CM | POA: Diagnosis not present

## 2022-09-04 DIAGNOSIS — E039 Hypothyroidism, unspecified: Secondary | ICD-10-CM | POA: Diagnosis not present

## 2022-09-04 DIAGNOSIS — R7989 Other specified abnormal findings of blood chemistry: Secondary | ICD-10-CM | POA: Diagnosis not present

## 2022-09-04 DIAGNOSIS — R7303 Prediabetes: Secondary | ICD-10-CM | POA: Diagnosis not present

## 2022-09-04 DIAGNOSIS — E785 Hyperlipidemia, unspecified: Secondary | ICD-10-CM | POA: Diagnosis not present

## 2022-09-10 DIAGNOSIS — L821 Other seborrheic keratosis: Secondary | ICD-10-CM | POA: Diagnosis not present

## 2022-09-10 DIAGNOSIS — L814 Other melanin hyperpigmentation: Secondary | ICD-10-CM | POA: Diagnosis not present

## 2022-09-10 DIAGNOSIS — L700 Acne vulgaris: Secondary | ICD-10-CM | POA: Diagnosis not present

## 2022-09-30 ENCOUNTER — Ambulatory Visit: Payer: BC Managed Care – PPO | Admitting: Internal Medicine

## 2022-10-04 DIAGNOSIS — E039 Hypothyroidism, unspecified: Secondary | ICD-10-CM | POA: Diagnosis not present

## 2022-10-04 DIAGNOSIS — R7303 Prediabetes: Secondary | ICD-10-CM | POA: Diagnosis not present

## 2022-10-04 DIAGNOSIS — E785 Hyperlipidemia, unspecified: Secondary | ICD-10-CM | POA: Diagnosis not present

## 2022-10-16 ENCOUNTER — Other Ambulatory Visit: Payer: Self-pay | Admitting: Internal Medicine

## 2022-11-14 ENCOUNTER — Encounter: Payer: Self-pay | Admitting: Internal Medicine

## 2022-11-14 ENCOUNTER — Ambulatory Visit (INDEPENDENT_AMBULATORY_CARE_PROVIDER_SITE_OTHER): Payer: BC Managed Care – PPO | Admitting: Internal Medicine

## 2022-11-14 VITALS — BP 110/64 | HR 73 | Ht 66.0 in | Wt 176.0 lb

## 2022-11-14 DIAGNOSIS — K5080 Crohn's disease of both small and large intestine without complications: Secondary | ICD-10-CM

## 2022-11-14 MED ORDER — MESALAMINE 1.2 G PO TBEC: 4.8000 g | DELAYED_RELEASE_TABLET | Freq: Every day | ORAL | 6 refills | Status: DC

## 2022-11-14 NOTE — Progress Notes (Signed)
Shannon Singh 42 y.o. [birth year] [id number]  Assessment & Plan:   Encounter Diagnosis  Name Primary?   Crohn's disease of both small and large intestine without complication ([place]) Yes   She seems to be controlled symptomatically.  She is struggling with adherence to the twice daily Lialda.  She will try taking the total dose once rather than twice daily.  4.8 g daily.  It is costly with her pharmacy benefit program so she may try purchasing it through [place].  Calprotectin level checked today.  Depending upon what that shows this schedule a colonoscopy to look for endoscopic remission.  If calprotectin is elevated may need to give some time with better adherence to 4.8 g dosage before repeating that versus colonoscopy.  She had labs done within the last year and we will look those up through [place] and send those to me.  Follow-up within 1 year. Subjective:   Chief Complaint: Follow-up of Crohn's disease  HPI 42 year old white woman with a history of Crohn's ileocolitis diagnosed around 2006 or 7.  She has responded to mesalamine therapy there have been.'s where she has chosen to go without that, but she has been back on mesalamine in the form of Lialda for the past couple of years.  Colonoscopy April 2022 normal terminal ileum and scattered aphthous ulcerations throughout the colon and rectum.  Biopsies confirm same findings.  Since that time she has been on Lialda though she admits there are times where she does not take it in the afternoon or evening.  Travel schedule with work etc. makes that difficult.  She generally gets at least 2.4 g and daily.  She has rare rectal bleeding that is been months since that happened and it has been freshened).  No diarrhea or abdominal pain reported except rarely.  She goes to [place] and says she has had labs there within the last year.  She is on a supplement to try to help with hemoglobin A1c level and a digestive enzyme  supplement.  Not on thyroid replacement anymore. No Known Allergies Current Meds  Medication Sig   levocetirizine (XYZAL) 2.5 MG/5ML solution Take 2.5 mg by mouth every evening. For allergies   mesalamine (LIALDA) 1.2 g EC tablet TAKE 2 TABLETS BY MOUTH TWICE A DAY   montelukast (SINGULAIR) 10 MG tablet Take 10 mg by mouth at bedtime.   Probiotic Product (PROBIOTIC DAILY PO) Take 1 tablet by mouth daily. Pt takes, Probiotic of America brand   spironolactone (ALDACTONE) 50 MG tablet Take 50 mg by mouth daily.   Past Medical History:  Diagnosis Date   Allergy    Anemia    borderline   Asthma    exrecised induced   Chicken pox    Crohn's ileocolitis ([place]) 06/03/2012   Depression    Heart murmur    mild murmur   History of migraine headaches    Past Surgical History:  Procedure Laterality Date   CESAREAN SECTION  12/2009   twins   COLONOSCOPY  06/25/2018   Raeshawn Tafolla   COLONOSCOPY W/ BIOPSIES  2008, 2014   MOUTH SURGERY     7th- 8th grade    Social History   Social History Narrative   Married, and children born 2011, 1 son 1 daughter - twins   Employed in [employer]   family history includes Colon polyps in her father; Healthy in her daughter and son; Heart disease in her maternal grandmother; Prostate cancer in her maternal grandfather.  Review of Systems As above  Objective:   Physical Exam @BP$  110/64   Pulse 73   Ht 5' 6"$  (1.676 m)   Wt 176 lb (79.8 kg)   BMI 28.41 kg/m @  General:  NAD Eyes:   anicteric Lungs:  clear Heart::  S1S2 no rubs, murmurs or gallops Abdomen:  soft and nontender, BS+ Ext:   no edema, cyanosis or clubbing    Data Reviewed:  See above

## 2022-11-14 NOTE — Patient Instructions (Signed)
Please send your lab work to [place]. Either thru [facility] or fax to [phone].  Your provider has requested that you go to the basement level for lab work before leaving today. Press "B" on the elevator. The lab is located at the first door on the left as you exit the elevator.  Due to recent changes in healthcare laws, you may see the results of your imaging and laboratory studies on MyChart before your provider has had a chance to review them.  We understand that in some cases there may be results that are confusing or concerning to you. Not all laboratory results come back in the same time frame and the provider may be waiting for multiple results in order to interpret others.  Please give [place] 48 hours in order for your provider to thoroughly review all the results before contacting the office for clarification of your results.   We are providing you with a printed rx for Lialda today.   I appreciate the opportunity to care for you. Silvano Rusk, MD, [facility]

## 2022-11-26 ENCOUNTER — Other Ambulatory Visit: Payer: BC Managed Care – PPO

## 2022-11-26 DIAGNOSIS — K5080 Crohn's disease of both small and large intestine without complications: Secondary | ICD-10-CM | POA: Diagnosis not present

## 2022-11-28 DIAGNOSIS — L709 Acne, unspecified: Secondary | ICD-10-CM | POA: Diagnosis not present

## 2022-12-01 LAB — CALPROTECTIN, FECAL: Calprotectin, Fecal: 8 ug/g (ref 0–120)

## 2022-12-18 ENCOUNTER — Other Ambulatory Visit: Payer: Self-pay

## 2022-12-18 ENCOUNTER — Other Ambulatory Visit: Payer: BC Managed Care – PPO

## 2022-12-18 ENCOUNTER — Encounter: Payer: Self-pay | Admitting: Internal Medicine

## 2022-12-18 ENCOUNTER — Ambulatory Visit (INDEPENDENT_AMBULATORY_CARE_PROVIDER_SITE_OTHER): Payer: BC Managed Care – PPO

## 2022-12-18 DIAGNOSIS — Z23 Encounter for immunization: Secondary | ICD-10-CM | POA: Diagnosis not present

## 2022-12-18 DIAGNOSIS — Z111 Encounter for screening for respiratory tuberculosis: Secondary | ICD-10-CM

## 2022-12-18 DIAGNOSIS — Z2839 Other underimmunization status: Secondary | ICD-10-CM | POA: Diagnosis not present

## 2022-12-18 DIAGNOSIS — Z789 Other specified health status: Secondary | ICD-10-CM

## 2022-12-18 NOTE — Addendum Note (Signed)
Addended by: Doran Clay A on: 12/18/2022 10:29 AM   Modules accepted: Orders

## 2022-12-18 NOTE — Progress Notes (Signed)
Pt received Tdap in left deltoid, pt tolerated well.

## 2022-12-18 NOTE — Addendum Note (Signed)
Addended by: Jodell Cipro on: 12/18/2022 09:41 AM   Modules accepted: Orders

## 2022-12-18 NOTE — Progress Notes (Signed)
Patient has set up a pre-visit and colon appointments.

## 2022-12-19 LAB — HEPATITIS B SURFACE ANTIBODY, QUANTITATIVE: Hep B S AB Quant (Post): 15 m[IU]/mL (ref >=10)

## 2022-12-19 LAB — HEPATITIS A ANTIBODY, IGM: Hep A IgM: NONREACTIVE

## 2022-12-19 LAB — MEASLES/MUMPS/RUBELLA IMMUNITY
Mumps IgG: <9 AU/mL — ABNORMAL LOW
Rubella: 8.9 Index
Rubeola IgG: 157 AU/mL

## 2022-12-19 LAB — HEPATITIS B CORE ANTIBODY, IGM: Hep B C IgM: NONREACTIVE

## 2022-12-19 LAB — VARICELLA ZOSTER ANTIBODY, IGG: Varicella IgG: 3006 index

## 2022-12-20 LAB — QUANTIFERON-TB GOLD PLUS
Mitogen-NIL: >10 IU/mL
NIL: 0.04 IU/mL
QuantiFERON-TB Gold Plus: NEGATIVE
TB1-NIL: 0.06 IU/mL
TB2-NIL: 0.1 IU/mL

## 2022-12-26 ENCOUNTER — Ambulatory Visit (AMBULATORY_SURGERY_CENTER): Payer: BC Managed Care – PPO

## 2022-12-26 ENCOUNTER — Encounter: Payer: Self-pay | Admitting: Internal Medicine

## 2022-12-26 VITALS — Ht 66.0 in | Wt 176.0 lb

## 2022-12-26 DIAGNOSIS — K5080 Crohn's disease of both small and large intestine without complications: Secondary | ICD-10-CM

## 2022-12-26 NOTE — Progress Notes (Signed)

## 2022-12-30 DIAGNOSIS — Z1231 Encounter for screening mammogram for malignant neoplasm of breast: Secondary | ICD-10-CM | POA: Diagnosis not present

## 2022-12-30 DIAGNOSIS — Z6828 Body mass index (BMI) 28.0-28.9, adult: Secondary | ICD-10-CM | POA: Diagnosis not present

## 2022-12-30 DIAGNOSIS — L659 Nonscarring hair loss, unspecified: Secondary | ICD-10-CM | POA: Diagnosis not present

## 2022-12-30 DIAGNOSIS — Z01419 Encounter for gynecological examination (general) (routine) without abnormal findings: Secondary | ICD-10-CM | POA: Diagnosis not present

## 2023-01-01 ENCOUNTER — Ambulatory Visit (INDEPENDENT_AMBULATORY_CARE_PROVIDER_SITE_OTHER): Payer: BC Managed Care – PPO

## 2023-01-01 DIAGNOSIS — Z23 Encounter for immunization: Secondary | ICD-10-CM

## 2023-01-19 LAB — HM PAP SMEAR

## 2023-01-21 ENCOUNTER — Ambulatory Visit (AMBULATORY_SURGERY_CENTER): Payer: BC Managed Care – PPO | Admitting: Internal Medicine

## 2023-01-21 ENCOUNTER — Encounter: Payer: Self-pay | Admitting: Internal Medicine

## 2023-01-21 VITALS — BP 112/67 | HR 63 | Temp 98.0°F | Resp 15 | Ht 66.0 in | Wt 176.0 lb

## 2023-01-21 DIAGNOSIS — K5080 Crohn's disease of both small and large intestine without complications: Secondary | ICD-10-CM | POA: Diagnosis not present

## 2023-01-21 DIAGNOSIS — Z0389 Encounter for observation for other suspected diseases and conditions ruled out: Secondary | ICD-10-CM | POA: Diagnosis not present

## 2023-01-21 MED ORDER — SODIUM CHLORIDE 0.9 % IV SOLN: 500.0000 mL | Freq: Once | INTRAVENOUS | Status: DC

## 2023-01-21 NOTE — Progress Notes (Signed)
[facility] History and Physical   Primary Care Physician:  Willow Ora, MD   Reason for Procedure:   crohn's  Plan:    colonoscopy     HPI: Shannon Singh is a 42 y.o. female for colonoscopy to reassess Crohn's disease. She is on Lialda Last colonoscopy 4/22 - The examined portion of the ileum was normal.                            Biopsied.                           - Simple Endoscopic Score for Crohn's Disease: 16,                            mucosal inflammatory changes secondary to Crohn's                            disease, with ileitis and colitis. Biopsied.                           - The examination was otherwise normal on direct                            and retroflexion views. 1. Surgical [P], small bowel, terminal ileum - BENIGN SMALL BOWEL MUCOSA. - NO ACTIVE INFLAMMATION. - NO DYSPLASIA OR MALIGNANCY. 2. Surgical [P], right colon - CHRONIC MODERATELY ACTIVE COLITIS. - NO DYSPLASIA OR MALIGNANCY. 3. Surgical [P], left colon - CHRONIC MODERATELY ACTIVE COLITIS. - NO DYSPLASIA OR MALIGNANCY.  Calprotectin was 8 3/24  Past Medical History:  Diagnosis Date   Allergy    Anemia    borderline   Asthma    exrecised induced   Chicken pox    Crohn's ileocolitis ([facility]) 06/03/2012   Depression    Heart murmur    mild murmur   History of migraine headaches     Past Surgical History:  Procedure Laterality Date   CESAREAN SECTION  12/2009   twins   COLONOSCOPY  06/25/2018   Christophr Calix   COLONOSCOPY W/ BIOPSIES  2008, 2014   MOUTH SURGERY     7th- 8th grade     Prior to Admission medications   Medication Sig Start Date End Date Taking? Authorizing Provider  Cetirizine HCl (ZYRTEC ALLERGY) 10 MG CAPS Zyrtec   Yes [provider]  mesalamine (LIALDA) 1.2 g EC tablet Take 4 tablets (4.8 g total) by mouth daily with breakfast. 11/14/22  Yes Iva Boop, MD  Probiotic Product (PROBIOTIC DAILY PO) Take 1 tablet by mouth daily. Pt takes,  Probiotic of America brand   Yes [provider]  spironolactone (ALDACTONE) 50 MG tablet Take 50 mg by mouth daily. 11/12/21  Yes [provider]  levocetirizine (XYZAL) 2.5 MG/5ML solution Take 2.5 mg by mouth every evening. For allergies    [provider]  montelukast (SINGULAIR) 10 MG tablet Take 10 mg by mouth at bedtime. Patient not taking: Reported on 12/26/2022    [provider]    Current Outpatient Medications  Medication Sig Dispense Refill   Cetirizine HCl (ZYRTEC ALLERGY) 10 MG CAPS Zyrtec     mesalamine (LIALDA) 1.2 g EC tablet Take 4 tablets (4.8 g total)  by mouth daily with breakfast. 200 tablet 6   Probiotic Product (PROBIOTIC DAILY PO) Take 1 tablet by mouth daily. Pt takes, Probiotic of America brand     spironolactone (ALDACTONE) 50 MG tablet Take 50 mg by mouth daily.     levocetirizine (XYZAL) 2.5 MG/5ML solution Take 2.5 mg by mouth every evening. For allergies     montelukast (SINGULAIR) 10 MG tablet Take 10 mg by mouth at bedtime. (Patient not taking: Reported on 12/26/2022)     Current Facility-Administered Medications  Medication Dose Route Frequency Provider Last Rate Last Admin   0.9 %  sodium chloride infusion  500 mL Intravenous Once Iva Boop, MD        Allergies as of 01/21/2023   (No Known Allergies)    Family History  Problem Relation Age of Onset   Colon polyps Father    Prostate cancer Maternal Grandfather    Heart disease Maternal Grandmother    Healthy Daughter    Healthy Son    Breast cancer Neg Hx    Colon cancer Neg Hx    Ovarian cancer Neg Hx    Esophageal cancer Neg Hx    Rectal cancer Neg Hx    Stomach cancer Neg Hx     Social History   Socioeconomic History   Marital status: Married    Spouse name: Not on file   Number of children: 2   Years of education: Not on file   Highest education level: Not on file  Occupational History   Occupation: botox rep for urology, bladder, [employer]     Comment: nov 2018   Occupation: Tax adviser  Tobacco Use   Smoking status: Never   Smokeless tobacco: Never  Vaping Use   Vaping Use: Never used  Substance and Sexual Activity   Alcohol use: Yes    Comment: 1 bottle wine/week, social   Drug use: No   Sexual activity: Yes    Partners: Male    Birth control/protection: Pill    Comment: Vasectomy   Other Topics Concern   Not on file  Social History Narrative   Married, and children born 2011, 1 son 1 daughter - twins   Employed in Manufacturing systems engineer   Social Determinants of Health   Financial Resource Strain: Not on file  Food Insecurity: Not on file  Transportation Needs: Not on file  Physical Activity: Not on file  Stress: Not on file  Social Connections: Not on file  Intimate Partner Violence: Not on file    Review of Systems:  All other review of systems negative except as mentioned in the HPI.  Physical Exam: Vital signs BP 104/64   Pulse 62   Temp 98 F (36.7 C)   Ht 5\' 6"  (1.676 m)   Wt 176 lb (79.8 kg)   LMP 12/15/2022   SpO2 98%   BMI 28.41 kg/m   General:   Alert,  Well-developed, well-nourished, pleasant and cooperative in NAD Lungs:  Clear throughout to auscultation.   Heart:  Regular rate and rhythm; no murmurs, clicks, rubs,  or gallops. Abdomen:  Soft, nontender and nondistended. Normal bowel sounds.   Neuro/Psych:  Alert and cooperative. Normal mood and affect. A and O x 3   @Lasaro Primm  Sena Slate, MD, [facility] [phone] (pager) 01/21/2023 9:48 AM@

## 2023-01-21 NOTE — Progress Notes (Signed)
Pt's states no medical or surgical changes since previsit or office visit. 

## 2023-01-21 NOTE — Patient Instructions (Addendum)
All looks normal!  Biopsies taken.  I will let you know.  I appreciate the opportunity to care for you. Iva Boop, MD, [facility]  Resume previous diet.  Continue present medications.  Await pathology results.  Repeat colonoscopy is recommended. The colonoscopy date will be determined after pathology results from today's exam become available for review.   YOU HAD AN ENDOSCOPIC PROCEDURE TODAY AT THE [facility]:   Refer to the procedure report that was given to you for any specific questions about what was found during the examination.  If the procedure report does not answer your questions, please call your gastroenterologist to clarify.  If you requested that your care partner not be given the details of your procedure findings, then the procedure report has been included in a sealed envelope for you to review at your convenience later.  YOU SHOULD EXPECT: Some feelings of bloating in the abdomen. Passage of more gas than usual.  Walking can help get rid of the air that was put into your GI tract during the procedure and reduce the bloating. If you had a lower endoscopy (such as a colonoscopy or flexible sigmoidoscopy) you may notice spotting of blood in your stool or on the toilet paper. If you underwent a bowel prep for your procedure, you may not have a normal bowel movement for a few days.  Please Note:  You might notice some irritation and congestion in your nose or some drainage.  This is from the oxygen used during your procedure.  There is no need for concern and it should clear up in a day or so.  SYMPTOMS TO REPORT IMMEDIATELY:  Following lower endoscopy (colonoscopy or flexible sigmoidoscopy):  Excessive amounts of blood in the stool  Significant tenderness or worsening of abdominal pains  Swelling of the abdomen that is new, acute  Fever of 100F or higher  For urgent or emergent issues, a gastroenterologist can be reached at any hour by calling [phone]  [phone]. Do not use MyChart messaging for urgent concerns.    DIET:  We do recommend a small meal at first, but then you may proceed to your regular diet.  Drink plenty of fluids but you should avoid alcoholic beverages for 24 hours.  ACTIVITY:  You should plan to take it easy for the rest of today and you should NOT DRIVE or use heavy machinery until tomorrow (because of the sedation medicines used during the test).    FOLLOW UP: Our staff will call the number listed on your records the next business day following your procedure.  We will call around 7:15- 8:00 am to check on you and address any questions or concerns that you may have regarding the information given to you following your procedure. If we do not reach you, we will leave a message.     If any biopsies were taken you will be contacted by phone or by letter within the next 1-3 weeks.  Please call us at [phone] if you have not heard about the biopsies in 3 weeks.    SIGNATURES/CONFIDENTIALITY: You and/or your care partner have signed paperwork which will be entered into your electronic medical record.  These signatures attest to the fact that that the information above on your After Visit Summary has been reviewed and is understood.  Full responsibility of the confidentiality of this discharge information lies with you and/or your care-partner.

## 2023-01-21 NOTE — Progress Notes (Signed)
Uneventful anesthetic. Report to pacu rn. Vss. Care resumed by rn. 

## 2023-01-21 NOTE — Progress Notes (Signed)
Called to room to assist during endoscopic procedure.  Patient ID and intended procedure confirmed with present staff. Received instructions for my participation in the procedure from the performing physician.  

## 2023-01-21 NOTE — Op Note (Signed)
[facility] Patient Name: Shannon Singh Procedure Date: 01/21/2023 9:46 AM MRN: [id number] Endoscopist: Iva Boop , MD, [id number] Age: 42 Referring MD:  Date of Birth: 05/08/1981 Gender: Female Account #: [id number] Procedure:                Colonoscopy Indications:              Crohn's disease of the small bowel and colon,                            Follow-up of Crohn's disease of the small bowel and                            colon, Disease activity assessment of Crohn's                            disease of the small bowel and colon, Assess                            therapeutic response to therapy of Crohn's disease                            of the small bowel and colon Medicines:                Monitored Anesthesia Care Procedure:                Pre-Anesthesia Assessment:                           - Prior to the procedure, a History and Physical                            was performed, and patient medications and                            allergies were reviewed. The patient's tolerance of                            previous anesthesia was also reviewed. The risks                            and benefits of the procedure and the sedation                            options and risks were discussed with the patient.                            All questions were answered, and informed consent                            was obtained. Prior Anticoagulants: The patient has                            taken no anticoagulant or antiplatelet agents. ASA  Grade Assessment: II - A patient with mild systemic                            disease. After reviewing the risks and benefits,                            the patient was deemed in satisfactory condition to                            undergo the procedure.                           After obtaining informed consent, the colonoscope                            was passed under direct vision.  Throughout the                            procedure, the patient's blood pressure, pulse, and                            oxygen saturations were monitored continuously. The                            PCF-HQ190L Colonoscope [id number] was introduced                            through the anus and advanced to the the terminal                            ileum, with identification of the appendiceal                            orifice and IC valve. The colonoscopy was performed                            without difficulty. The patient tolerated the                            procedure well. The quality of the bowel                            preparation was good. The terminal ileum, ileocecal                            valve, appendiceal orifice, and rectum were                            photographed. The bowel preparation used was                            Miralax via split dose instruction. Scope In: 9:58:26 AM Scope Out: 10:14:14 AM Scope Withdrawal Time: 0 hours 11 minutes 41 seconds  Total Procedure Duration: 0 hours  15 minutes 48 seconds  Findings:                 The perianal and digital rectal examinations were                            normal.                           The terminal ileum appeared normal. Biopsies were                            taken with a cold forceps for histology.                            Verification of patient identification for the                            specimen was done. Estimated blood loss was minimal.                           The entire examined colon appeared normal on direct                            and retroflexion views. Biopsies were taken with a                            cold forceps for histology. Verification of patient                            identification for the specimen was done. Estimated                            blood loss was minimal. Complications:            No immediate complications. Estimated Blood Loss:     Estimated  blood loss was minimal. Impression:               - The examined portion of the ileum was normal.                            Biopsied.                           - The entire examined colon is normal on direct and                            retroflexion views. No endoscopic signs of active                            crohn's on Lialda 4.8 g daily Recommendation:           - Patient has a contact number available for                            emergencies. The signs and symptoms of potential  delayed complications were discussed with the                            patient. Return to normal activities tomorrow.                            Written discharge instructions were provided to the                            patient.                           - Resume previous diet.                           - Continue present medications.                           - Await pathology results.                           - Repeat colonoscopy is recommended. The                            colonoscopy date will be determined after pathology                            results from today's exam become available for                            review. Iva Boop, MD 01/21/2023 10:22:28 AM This report has been signed electronically.

## 2023-01-22 ENCOUNTER — Telehealth: Payer: Self-pay

## 2023-01-22 NOTE — Telephone Encounter (Signed)
Attempted f/u call. No answer, left VM. 

## 2023-02-02 ENCOUNTER — Encounter: Payer: Self-pay | Admitting: Internal Medicine

## 2023-02-19 ENCOUNTER — Ambulatory Visit (INDEPENDENT_AMBULATORY_CARE_PROVIDER_SITE_OTHER): Payer: BC Managed Care – PPO

## 2023-02-19 DIAGNOSIS — Z23 Encounter for immunization: Secondary | ICD-10-CM | POA: Diagnosis not present

## 2023-02-19 NOTE — Progress Notes (Signed)
Pt is here for 2nd MMR vaccine, Given in right deltoid. Pt tolerated well.

## 2023-04-24 ENCOUNTER — Other Ambulatory Visit: Payer: Self-pay | Admitting: Internal Medicine

## 2023-10-10 ENCOUNTER — Encounter: Payer: Self-pay | Admitting: Family Medicine

## 2023-10-10 ENCOUNTER — Other Ambulatory Visit: Payer: Self-pay

## 2023-10-10 MED ORDER — MONTELUKAST SODIUM 10 MG PO TABS: 10.0000 mg | ORAL_TABLET | Freq: Every day | ORAL | 1 refills | Status: DC

## 2023-10-10 NOTE — Telephone Encounter (Signed)
Called pt and informed of message below. Pt stated she doesn't believe she is at the point of a sinus infection. States she is going to try the singulair and some OTC decongestant. Stated if needed she will schedule an OV.

## 2023-10-10 NOTE — Telephone Encounter (Signed)
Noted  

## 2023-10-21 ENCOUNTER — Ambulatory Visit (INDEPENDENT_AMBULATORY_CARE_PROVIDER_SITE_OTHER): Payer: 59 | Admitting: Family Medicine

## 2023-10-21 ENCOUNTER — Encounter: Payer: Self-pay | Admitting: Family Medicine

## 2023-10-21 VITALS — BP 123/79 | HR 63 | Temp 98.1°F | Ht 66.0 in | Wt 170.8 lb

## 2023-10-21 DIAGNOSIS — J309 Allergic rhinitis, unspecified: Secondary | ICD-10-CM | POA: Diagnosis not present

## 2023-10-21 DIAGNOSIS — Z13220 Encounter for screening for lipoid disorders: Secondary | ICD-10-CM | POA: Diagnosis not present

## 2023-10-21 DIAGNOSIS — K50919 Crohn's disease, unspecified, with unspecified complications: Secondary | ICD-10-CM

## 2023-10-21 DIAGNOSIS — Z3041 Encounter for surveillance of contraceptive pills: Secondary | ICD-10-CM

## 2023-10-21 DIAGNOSIS — Z0000 Encounter for general adult medical examination without abnormal findings: Secondary | ICD-10-CM | POA: Diagnosis not present

## 2023-10-21 DIAGNOSIS — Z0001 Encounter for general adult medical examination with abnormal findings: Secondary | ICD-10-CM

## 2023-10-21 DIAGNOSIS — L700 Acne vulgaris: Secondary | ICD-10-CM

## 2023-10-21 DIAGNOSIS — Z1159 Encounter for screening for other viral diseases: Secondary | ICD-10-CM

## 2023-10-21 LAB — CBC WITH DIFFERENTIAL/PLATELET
Basophils Absolute: 0 10*3/uL (ref 0.0–0.1)
Basophils Relative: 0.9 % (ref 0.0–3.0)
Eosinophils Absolute: 0.1 10*3/uL (ref 0.0–0.7)
Eosinophils Relative: 1.3 % (ref 0.0–5.0)
HCT: 37.8 % (ref 36.0–46.0)
Hemoglobin: 12.4 g/dL (ref 12.0–15.0)
Lymphocytes Relative: 42.5 % (ref 12.0–46.0)
Lymphs Abs: 2 10*3/uL (ref 0.7–4.0)
MCHC: 32.9 g/dL (ref 30.0–36.0)
MCV: 89.5 fL (ref 78.0–100.0)
Monocytes Absolute: 0.5 10*3/uL (ref 0.1–1.0)
Monocytes Relative: 11.3 % (ref 3.0–12.0)
Neutro Abs: 2.1 10*3/uL (ref 1.4–7.7)
Neutrophils Relative %: 44 % (ref 43.0–77.0)
Platelets: 385 10*3/uL (ref 150.0–400.0)
RBC: 4.22 Mil/uL (ref 3.87–5.11)
RDW: 14.6 % (ref 11.5–15.5)
WBC: 4.7 10*3/uL (ref 4.0–10.5)

## 2023-10-21 LAB — COMPREHENSIVE METABOLIC PANEL
ALT: 15 U/L (ref 0–35)
AST: 22 U/L (ref 0–37)
Albumin: 4.2 g/dL (ref 3.5–5.2)
Alkaline Phosphatase: 62 U/L (ref 39–117)
BUN: 12 mg/dL (ref 6–23)
CO2: 29 meq/L (ref 19–32)
Calcium: 9 mg/dL (ref 8.4–10.5)
Chloride: 102 meq/L (ref 96–112)
Creatinine, Ser: 0.75 mg/dL (ref 0.40–1.20)
GFR: 97.25 mL/min (ref >60.00)
Glucose, Bld: 87 mg/dL (ref 70–99)
Potassium: 4.5 meq/L (ref 3.5–5.1)
Sodium: 136 meq/L (ref 135–145)
Total Bilirubin: 0.2 mg/dL (ref 0.2–1.2)
Total Protein: 7.2 g/dL (ref 6.0–8.3)

## 2023-10-21 LAB — LIPID PANEL
Cholesterol: 164 mg/dL (ref 0–200)
HDL: 40.3 mg/dL (ref >39.00)
LDL Cholesterol: 107 mg/dL — ABNORMAL HIGH (ref 0–99)
NonHDL: 123.54
Total CHOL/HDL Ratio: 4
Triglycerides: 83 mg/dL (ref 0.0–149.0)
VLDL: 16.6 mg/dL (ref 0.0–40.0)

## 2023-10-21 LAB — TSH: TSH: 0.99 u[IU]/mL (ref 0.35–5.50)

## 2023-10-21 NOTE — Patient Instructions (Signed)
Please return in 12 months for complete physical  I will release your lab results to you on your MyChart account with further instructions. You may see the results before I do, but when I review them I will send you a message with my report or have my assistant call you if things need to be discussed. Please reply to my message with any questions. Thank you!   If you have any questions or concerns, please don't hesitate to send me a message via MyChart or call the office at [phone]. Thank you for visiting with [place] today! It's our pleasure caring for you.   Please do these things to maintain good health!  Exercise at least 30-45 minutes a day,  4-5 days a week.  Eat a low-fat diet with lots of fruits and vegetables, up to 7-9 servings per day. Drink plenty of water daily. Try to drink 8 8oz glasses per day. Seatbelts can save your life. Always wear your seatbelt. Place Smoke Detectors on every level of your home and check batteries every year. Schedule an appointment with an eye doctor for an eye exam every 1-2 years Safe sex - use condoms to protect yourself from STDs if you could be exposed to these types of infections. Use birth control if you do not want to become pregnant and are sexually active. Avoid heavy alcohol use. If you drink, keep it to less than 2 drinks/day and not every day. Health Care Power of Attorney.  Choose someone you trust that could speak for you if you became unable to speak for yourself. Depression is common in our stressful world.If you're feeling down or losing interest in things you normally enjoy, please come in for a visit. If anyone is threatening or hurting you, please get help. Physical or Emotional Violence is never OK.

## 2023-10-21 NOTE — Progress Notes (Signed)
Subjective  Chief Complaint  Patient presents with   Annual Exam    Pt here for Annual Exam and I is currently fasting. Pap is scheduled for April 28,2025    HPI: Shannon Singh is a 43 y.o. female who presents to [facility] [place] today for a Female Wellness Visit.  She also has the concerns and/or needs as listed above in the chief complaint. These will be addressed in addition to the Health Maintenance Visit.   Wellness Visit: annual visit with health maintenance review and exam Health maintenance: Sees [facility], reports had Pap smear last year but need to verify.  She does have scheduled visit for April of this year.  Immunizations are current.  Colonoscopies are current, Crohn's disease managed by GI.  Reviewed notes. Chronic disease management visit and/or acute problem visit: Acne managed by Derm on spironolactone and well-controlled Chronic allergies with history of exercise-induced asthma doing well on Xyzal and Singulair. Recovering from flu and sinus infection on Z-Pak Irregular cycle, started on low-dose oral contraceptives this week.  Assessment  1. Encounter for well adult exam with abnormal findings   2. Crohn's disease with complication, unspecified gastrointestinal tract location (HCC)   3. Need for hepatitis C screening test   4. Chronic allergic rhinitis   5. Acne vulgaris   6. Oral contraceptive use      Plan  Female Wellness Visit: Age appropriate Health Maintenance and Prevention measures were discussed with patient. Included topics are cancer screening recommendations, ways to keep healthy (see AVS) including dietary and exercise recommendations, regular eye and dental care, use of seat belts, and avoidance of moderate alcohol use and tobacco use.  Screens are current, will follow-up on getting Pap smear records BMI: discussed patient's BMI and encouraged positive lifestyle modifications to help get to or maintain a target  BMI. HM needs and immunizations were addressed and ordered. See below for orders. See HM and immunization section for updates.  Up-to-date Routine labs and screening tests ordered including cmp, cbc and lipids where appropriate. Discussed recommendations regarding Vit D and calcium supplementation (see AVS)  Chronic disease f/u and/or acute problem visit: (deemed necessary to be done in addition to the wellness visit): Chronic allergies: Well-controlled continue Singulair and Xyzal Acne controlled with spironolactone Oral contraceptive use noted Crohn's disease is well-controlled, monitored by GI  Follow up: 12 months for complete physical  Orders Placed This Encounter  Procedures   Hepatitis C antibody   Lipid panel   Comprehensive metabolic panel   CBC with Differential/Platelet   TSH   No orders of the defined types were placed in this encounter.      Body mass index is 27.57 kg/m. Wt Readings from Last 3 Encounters:  10/21/23 170 lb 12.8 oz (77.5 kg)  01/21/23 176 lb (79.8 kg)  12/26/22 176 lb (79.8 kg)   Need for contraception: No,   Patient Active Problem List   Diagnosis Date Noted Date Diagnosed   Crohn disease (HCC) 08/31/2012     Priority: High    Dxd 2008, Dr. Loreta Ave, GI    Crohn's ileocolitis (HCC) 06/03/2012     Priority: High    Treating holistically: Erick Colace; seeing her since 2017; currently well controlled. Sees Dr Leone Payor for GI.  Dxd: 01/28/2007. Findings showed colitis from about 10-40 cm with patches of ulcerations and erosions, 2 erosions in the mid right colon, a few erosions in the cecum, and a small erosions in the  terminal ileum 09/02/2013 Colonoscopy and terminal ileoscopy - in remission 2019 - flared off meds - active dz on colonoscopy and retx prednisone and Lialda     Oral contraceptive use 08/28/2018     Priority: Medium     For perimenopausal cycle control, started January 2025, GYN    Acne 11/28/2017     Priority: Medium      On spironalactone per derm    History of systemic steroid therapy 07/26/2013     Priority: Medium    Migraine headache 10/03/2007     Priority: Medium     Overview:     Chronic allergic rhinitis 10/21/2023     Priority: Low   Irritable bowel syndrome 06/01/2020    Health Maintenance  Topic Date Due   Hepatitis C Screening  Never done   COVID-19 Vaccine (2 - 2024-25 season) 11/06/2023 (Originally 05/25/2023)   Cervical Cancer Screening (HPV/Pap Cotest)  01/18/2026   Colonoscopy  01/20/2026   DTaP/Tdap/Td (2 - Td or Tdap) 12/17/2032   INFLUENZA VACCINE  Completed   HPV VACCINES  Completed   HIV Screening  Completed   Immunization History  Administered Date(s) Administered   HPV 9-valent 11/28/2017, 01/30/2018   HPV Quadrivalent 06/01/2018   Influenza Inj Mdck Quad Pf 07/10/2017   Influenza Nasal 07/08/2019   Influenza, Seasonal, Injecte, Preservative Fre 06/19/2023   Influenza,inj,Quad PF,6+ Mos 06/29/2018, 06/04/2022   Influenza,trivalent, recombinat, inj, PF 07/27/2011   Influenza-Unspecified 06/28/2013, 06/23/2017   Janssen (J&J) SARS-COV-2 Vaccination 12/05/2019   MMR 01/01/2023, 02/19/2023   PPD Test 12/15/2017, 12/15/2017   Tdap 12/18/2022   We updated and reviewed the patient's past history in detail and it is documented below. Allergies: Patient  reports current alcohol use. Past Medical History Patient  has a past medical history of Allergy, Anemia, Asthma, Chicken pox, Crohn's ileocolitis (HCC) (06/03/2012), Depression, Heart murmur, and History of migraine headaches. Past Surgical History Patient  has a past surgical history that includes Colonoscopy w/ biopsies (2008, 2014); Cesarean section (12/2009); Mouth surgery; and Colonoscopy (06/25/2018). Social History   Socioeconomic History   Marital status: Married    Spouse name: Not on file   Number of children: 2   Years of education: Not on file   Highest education level: Not on file  Occupational History    Occupation: botox rep for urology, bladder, [employer]    Comment: nov 2018   Occupation: SALES REP  Tobacco Use   Smoking status: Never   Smokeless tobacco: Never  Vaping Use   Vaping status: Never Used  Substance and Sexual Activity   Alcohol use: Yes    Comment: 1 bottle wine/week, social   Drug use: No   Sexual activity: Yes    Partners: Male    Birth control/protection: Pill    Comment: Vasectomy   Other Topics Concern   Not on file  Social History Narrative   Married, and children born 2011, 1 son 1 daughter - twins   Employed in Manufacturing systems engineer   Social Drivers of Health   Financial Resource Strain: Not on file  Food Insecurity: Not on file  Transportation Needs: Not on file  Physical Activity: Not on file  Stress: Not on file  Social Connections: Not on file   Family History  Problem Relation Age of Onset   Colon polyps Father    Prostate cancer Maternal Grandfather    Heart disease Maternal Grandmother    Healthy Daughter    Healthy Son    Breast  cancer Neg Hx    Colon cancer Neg Hx    Ovarian cancer Neg Hx    Esophageal cancer Neg Hx    Rectal cancer Neg Hx    Stomach cancer Neg Hx     Review of Systems: Constitutional: negative for fever or malaise Ophthalmic: negative for photophobia, double vision or loss of vision Cardiovascular: negative for chest pain, dyspnea on exertion, or new LE swelling Respiratory: negative for SOB or persistent cough Gastrointestinal: negative for abdominal pain, change in bowel habits or melena Genitourinary: negative for dysuria or gross hematuria, no abnormal uterine bleeding or disharge Musculoskeletal: negative for new gait disturbance or muscular weakness Integumentary: negative for new or persistent rashes, no breast lumps Neurological: negative for TIA or stroke symptoms Psychiatric: negative for SI or delusions Allergic/Immunologic: negative for hives  Patient Care Team    Relationship Specialty  Notifications Start End  Willow Ora, MD PCP - General Family Medicine  11/28/17   Iva Boop, MD Consulting Physician Gastroenterology  11/28/17   Zelphia Cairo, MD Consulting Physician Obstetrics and Gynecology  10/06/19   Dermatology, Ginette Otto    10/21/23   Mitchel Honour, DO Consulting Physician Obstetrics and Gynecology  10/21/23     Objective  Vitals: BP 123/79   Pulse 63   Temp 98.1 F (36.7 C)   Ht 5\' 6"  (1.676 m)   Wt 170 lb 12.8 oz (77.5 kg)   SpO2 97%   BMI 27.57 kg/m  General:  Well developed, well nourished, no acute distress  Psych:  Alert and orientedx3,normal mood and affect HEENT:  Normocephalic, atraumatic, non-icteric sclera, PERRL, supple neck without adenopathy, mass or thyromegaly, mild congestion Cardiovascular:  Normal S1, S2, RRR without gallop, rub or murmur Respiratory:  Good breath sounds bilaterally, CTAB with normal respiratory effort Gastrointestinal: normal bowel sounds, soft, non-tender, no noted masses. No HSM MSK: no deformities, contusions. Joints are without erythema or swelling.  Skin:  Warm, no rashes or suspicious lesions noted Neurologic:    Mental status is normal. Gross motor and sensory exams are normal. Normal gait. No tremor   Commons side effects, risks, benefits, and alternatives for medications and treatment plan prescribed today were discussed, and the patient expressed understanding of the given instructions. Patient is instructed to call or message via MyChart if he/she has any questions or concerns regarding our treatment plan. No barriers to understanding were identified. We discussed Red Flag symptoms and signs in detail. Patient expressed understanding regarding what to do in case of urgent or emergency type symptoms.  Medication list was reconciled, printed and provided to the patient in AVS. Patient instructions and summary information was reviewed with the patient as documented in the AVS. This note was prepared with  assistance of Dragon voice recognition software. Occasional wrong-word or sound-a-like substitutions may have occurred due to the inherent limitations of voice recognition software .

## 2023-10-22 LAB — HEPATITIS C ANTIBODY: Hepatitis C Ab: NONREACTIVE

## 2023-10-23 ENCOUNTER — Encounter: Payer: Self-pay | Admitting: Family Medicine

## 2023-10-23 NOTE — Progress Notes (Signed)
Labs reviewed.  The 10-year ASCVD risk score (Arnett DK, et al., 2019) is: 0.8%   Values used to calculate the score:     Age: 43 years     Sex: Female     Is Non-Hispanic African American: No     Diabetic: No     Tobacco smoker: No     Systolic Blood Pressure: 123 mmHg     Is BP treated: No     HDL Cholesterol: 40.3 mg/dL     Total Cholesterol: 164 mg/dL

## 2023-11-01 ENCOUNTER — Other Ambulatory Visit: Payer: Self-pay | Admitting: Family Medicine

## 2024-01-30 ENCOUNTER — Other Ambulatory Visit (INDEPENDENT_AMBULATORY_CARE_PROVIDER_SITE_OTHER)

## 2024-01-30 ENCOUNTER — Encounter: Payer: Self-pay | Admitting: Internal Medicine

## 2024-01-30 ENCOUNTER — Ambulatory Visit (INDEPENDENT_AMBULATORY_CARE_PROVIDER_SITE_OTHER): Admitting: Internal Medicine

## 2024-01-30 VITALS — BP 118/64 | HR 74

## 2024-01-30 DIAGNOSIS — K50811 Crohn's disease of both small and large intestine with rectal bleeding: Secondary | ICD-10-CM | POA: Diagnosis not present

## 2024-01-30 LAB — CBC WITH DIFFERENTIAL/PLATELET
Basophils Absolute: 0.1 10*3/uL (ref 0.0–0.1)
Basophils Relative: 1.1 % (ref 0.0–3.0)
Eosinophils Absolute: 0.2 10*3/uL (ref 0.0–0.7)
Eosinophils Relative: 2.2 % (ref 0.0–5.0)
HCT: 40 % (ref 36.0–46.0)
Hemoglobin: 13 g/dL (ref 12.0–15.0)
Lymphocytes Relative: 29 % (ref 12.0–46.0)
Lymphs Abs: 2.5 10*3/uL (ref 0.7–4.0)
MCHC: 32.6 g/dL (ref 30.0–36.0)
MCV: 90 fl (ref 78.0–100.0)
Monocytes Absolute: 0.9 10*3/uL (ref 0.1–1.0)
Monocytes Relative: 10.3 % (ref 3.0–12.0)
Neutro Abs: 5 10*3/uL (ref 1.4–7.7)
Neutrophils Relative %: 57.4 % (ref 43.0–77.0)
Platelets: 521 10*3/uL — ABNORMAL HIGH (ref 150.0–400.0)
RBC: 4.44 Mil/uL (ref 3.87–5.11)
RDW: 14.1 % (ref 11.5–15.5)
WBC: 8.7 10*3/uL (ref 4.0–10.5)

## 2024-01-30 LAB — COMPREHENSIVE METABOLIC PANEL WITH GFR
ALT: 15 U/L (ref 0–35)
AST: 15 U/L (ref 0–37)
Albumin: 4.4 g/dL (ref 3.5–5.2)
Alkaline Phosphatase: 127 U/L — ABNORMAL HIGH (ref 39–117)
BUN: 17 mg/dL (ref 6–23)
CO2: 25 meq/L (ref 19–32)
Calcium: 9.2 mg/dL (ref 8.4–10.5)
Chloride: 104 meq/L (ref 96–112)
Creatinine, Ser: 0.66 mg/dL (ref 0.40–1.20)
GFR: 106.94 mL/min (ref >60.00)
Glucose, Bld: 89 mg/dL (ref 70–99)
Potassium: 4.3 meq/L (ref 3.5–5.1)
Sodium: 135 meq/L (ref 135–145)
Total Bilirubin: 0.3 mg/dL (ref 0.2–1.2)
Total Protein: 7.7 g/dL (ref 6.0–8.3)

## 2024-01-30 LAB — SEDIMENTATION RATE: Sed Rate: 25 mm/h — ABNORMAL HIGH (ref 0–20)

## 2024-01-30 LAB — C-REACTIVE PROTEIN: CRP: <1 mg/dL (ref 0.5–20.0)

## 2024-01-30 MED ORDER — MESALAMINE 1.2 G PO TBEC: 2.4000 g | DELAYED_RELEASE_TABLET | Freq: Two times a day (BID) | ORAL | 3 refills | Status: AC

## 2024-01-30 NOTE — Patient Instructions (Signed)
 We have sent the following medications to your pharmacy for you to pick up at your convenience: Lialda   If you are not showing improvement quickly enough call us  back.   Your provider has requested that you go to the basement level for lab work before leaving today. Press "B" on the elevator. The lab is located at the first door on the left as you exit the elevator.  Due to recent changes in healthcare laws, you may see the results of your imaging and laboratory studies on MyChart before your provider has had a chance to review them.  We understand that in some cases there may be results that are confusing or concerning to you. Not all laboratory results come back in the same time frame and the provider may be waiting for multiple results in order to interpret others.  Please give us  48 hours in order for your provider to thoroughly review all the results before contacting the office for clarification of your results.   _______________________________________________________  If your blood pressure at your visit was 140/90 or greater, please contact your primary care physician to follow up on this.  _______________________________________________________  If you are age 38 or older, your body mass index should be between 23-30. Your There is no height or weight on file to calculate BMI. If this is out of the aforementioned range listed, please consider follow up with your Primary Care Provider.  If you are age 74 or younger, your body mass index should be between 19-25. Your There is no height or weight on file to calculate BMI. If this is out of the aformentioned range listed, please consider follow up with your Primary Care Provider.   ________________________________________________________  The [facility] providers would like to encourage you to use MYCHART to communicate with providers for non-urgent requests or questions.  Due to long hold times on the telephone, sending your provider a  message by [facility] may be a faster and more efficient way to get a response.  Please allow 48 business hours for a response.  Please remember that this is for non-urgent requests.  _______________________________________________________  I appreciate the opportunity to care for you. Loy Ruff, MD, [facility]

## 2024-01-30 NOTE — Progress Notes (Signed)
 I saw the patient in conjunction with the physician assistant.  Crohn's flare due to medication nonadherence.  Restart Lialda  hold prednisone  in reserve.

## 2024-01-30 NOTE — Progress Notes (Signed)
 Shannon Singh [id number] 10-01-1980   Chief Complaint: Blood in stool, urgent loose stool, abdominal discomfort/cramping  Referring Provider: Luevenia Saha, MD Primary GI MD: Dr. Willy Harvest  HPI: Shannon Singh is a 43 y.o. female with past medical history of Crohn's ileocolitis diagnosed 2006/2007, who presents today for a complaint of blood in her stool, diarrhea, and abdominal cramping.   Patient last seen in office 11/14/2022 by Dr. Willy Harvest for follow-up of her Crohn's disease.  She has responded to mesalamine  therapy, and though there have been times where she has chosen to go without it, she has been back on mesalamine  in the form of Lialda  for the past few years.  Patient's symptoms seemed to be controlled at at time of last visit, and plan was to have her take the total dose of 4.8 g Lialda  once daily rather than splitting the dose to twice daily to help with adherence. Fecal calprotectin level checked and normal at that time.  Patient had labs done in January - Normal CBC, CMP, TSH, nonreactive Hep C antibody.  Patient states she ran out of her Lialda  about 3 to 4 months ago, got busy, and did not call for refill.  Initially she was doing fine after stopping the medication, but about a month ago started noticing blood in her stool.  She thought this might be due to skin irritation from excessive wiping, but soon she began to notice an increased frequency of bleeding, mucus in her stool, foul-smelling gas, then started to have frequent loose stools with associated urgency.  She has been having 3-4 bowel movements per day and stools are almost always loose and bloody.  She will sometimes have a sensation of urgency but then goes to the bathroom and only passes gas, mucus, and blood.  She denies any rectal pain.  She has had some lower abdominal discomfort almost daily which intensifies with her menstrual cycle, as well as abdominal cramping before a bowel movement that resolves after a bowel  movement.  She denies nausea, vomiting, fever.  She has had some food aversions at times.  Prior to running out of her Lialda , patient was having 1-2 normal formed bowel movements daily without bleeding or abdominal discomfort.  She had been taking the total 4.8 g dose daily and was not missing doses.  She denies any recent antibiotic use, denies sick contacts or recent travel.  Patient thinks that she is perimenopausal, though this has not been diagnosed.  In December she had a longer than normal period with prolonged bleeding and was told by her gynecologist that she may have had a ruptured cyst.  She is now on progesterone  which has been helping but still having irregularity, occasional spotting.  She wonders if hormonal changes could be contributing to her IBD flare.  She reports the last time she was in a flare was a couple of years ago.  She has not been on prednisone  in years.  States that currently her symptoms are not terrible but are bothersome. She does not some aching in her hips but is unsure if this is due to exercise.  Previous GI Procedures   Colonoscopy 01/21/2023 - The examined portion of the ileum was normal. Biopsied.  - The entire examined colon is normal on direct and retroflexion views. No endoscopic signs of active crohn' s on Lialda  4. 8 g daily - Recall 3 years Path: 1. Surgical [P], small bowel, terminal ileum - BENIGN SMALL BOWEL MUCOSA WITH NO SIGNIFICANT  PATHOLOGIC CHANGES - NEGATIVE FOR FEATURES OF CHRONICITY OR ACUTE INFLAMMATION 2. Surgical [P], colon, ascending and transverse biopsies - BENIGN COLONIC MUCOSA WITH NO SPECIFIC PATHOLOGIC CHANGES - NEGATIVE FOR FEATURES OF CHRONICITY OR ACUTE INFLAMMATION - NEGATIVE FOR DYSPLASIA OR MALIGNANCY 3. Surgical [P], colon, descending sigmoid and rectum - BENIGN COLONIC MUCOSA WITH NO SPECIFIC PATHOLOGIC CHANGES - NEGATIVE FOR FEATURES OF CHRONICITY OR ACUTE INFLAMMATION - NEGATIVE FOR DYSPLASIA OR  MALIGNANCY  Colonoscopy 12/29/2020 - The examined portion of the ileum was normal. Biopsied.  - Simple Endoscopic Score for Crohn' s Disease: 16, mucosal inflammatory changes secondary to Crohn' s disease, with ileitis and colitis. Biopsied.  - The examination was otherwise normal on direct and retroflexion views. Path: 1. Surgical [P], small bowel, terminal ileum - BENIGN SMALL BOWEL MUCOSA. - NO ACTIVE INFLAMMATION. - NO DYSPLASIA OR MALIGNANCY. 2. Surgical [P], right colon - CHRONIC MODERATELY ACTIVE COLITIS. - NO DYSPLASIA OR MALIGNANCY. 3. Surgical [P], left colon - CHRONIC MODERATELY ACTIVE COLITIS. - NO DYSPLASIA OR MALIGNANCY.  Colonoscopy 06/25/2018 - Inflammatory bowel disease. Inflammation was found from the anus to the distal sigmoid colon and in the cecum ( appendiceal patch) . This was moderate in severity. Biopsied.  - The examined portion of the ileum was normal. Biopsied.  - The examination was otherwise normal on direct and retroflexion views. Path: 1. Surgical [P], terminal ileum - ILEAL MUCOSA WITH BENIGN LYMPHOID AGGREGATE. - NO ACTIVE INFLAMMATION, CHRONIC CHANGES OR GRANULOMAS. 2. Surgical [P], periappendiceal - CHRONIC, MINIMALLY ACTIVE COLITIS. - NEGATIVE FOR DYSPLASIA. - NO GRANULOMAS IDENTIFIED. 3. Surgical [P], normal appearing colon - COLONIC MUCOSA WITH NO SIGNIFICANT PATHOLOGIC CHANGES. - NO ACTIVE INFLAMMATION, GRANULOMAS OR MICROSCOPIC COLITIS. 4. Surgical [P], rectum and sigmoid - CHRONIC MILDLY ACTIVE COLITIS. - NEGATIVE FOR DYSPLASIA. - NO GRANULOMAS IDENTIFIED.  Colonoscopy 09/02/2013 - Normal mucosa in the terminal ileum -The colonic mucosa appeared normal throughout the entire examined colon, Crohn's ileocolitis is in remission on Lialda   Colonoscopy 01/28/2007 - Small nonbleeding internal hemorrhoids -Inflammatory changes noted from 10 cm to 40 cm.  Biopsies done.  Inflammation was noted in patchy areas with erosions and ulcerations.   Question Crohn's disease. - Normal-appearing transverse colon and distal right colon - A couple of erosions biopsied from the right colon and cecum.  The rest of the mucosa in the cecum and right colon appeared normal. - 2 erosions biopsied from the terminal ileum.   Past Medical History:  Diagnosis Date   Allergy    Anemia    borderline   Asthma    exrecised induced   Chicken pox    Crohn's ileocolitis (HCC) 06/03/2012   Depression    Heart murmur    mild murmur   History of migraine headaches     Past Surgical History:  Procedure Laterality Date   CESAREAN SECTION  12/2009   twins   COLONOSCOPY  06/25/2018   [facility]   COLONOSCOPY W/ BIOPSIES  2008, 2014   MOUTH SURGERY     7th- 8th grade     Current Outpatient Medications  Medication Sig Dispense Refill   levocetirizine (XYZAL) 2.5 MG/5ML solution Take 2.5 mg by mouth every evening. For allergies     LO LOESTRIN FE 1 MG-10 MCG / 10 MCG tablet Take 1 tablet by mouth daily.     mesalamine  (LIALDA ) 1.2 g EC tablet Take 4 tablets (4.8 g total) by mouth daily with breakfast. 360 tablet 3   montelukast  (SINGULAIR ) 10 MG tablet TAKE 1  TABLET BY MOUTH EVERYDAY AT BEDTIME 90 tablet 1   Probiotic Product (PROBIOTIC DAILY PO) Take 1 tablet by mouth daily. Pt takes, Pro[employer]otic [employer] brand     spironolactone (ALDACTONE) 50 MG tablet Take 50 mg by mouth daily.     No current facility-administered medications for this visit.    Allergies as of 01/30/2024   (No Known Allergies)    Family History  Problem Relation Age of Onset   Colon polyps Father    Prostate cancer Maternal Grandfather    Heart disease Maternal Grandmother    Healthy Daughter    Healthy Son    Breast cancer Neg Hx    Colon cancer Neg Hx    Ovarian cancer Neg Hx    Esophageal cancer Neg Hx    Rectal cancer Neg Hx    Stomach cancer Neg Hx     Social History   Tobacco Use   Smoking status: Never   Smokeless tobacco: Never  Vaping Use   Vaping  status: Never Used  Substance Use Topics   Alcohol use: Yes    Comment: 1 bottle wine/week, social   Drug use: No     Review of Systems:    Constitutional: No weight loss, fever, chills, weakness or fatigue HEENT: Eyes: No change in vision Ears, Nose, Throat:  No change in hearing or congestion Skin: No rash or itching Cardiovascular: No chest pain, chest pressure or palpitations   Respiratory: No SOB or cough Gastrointestinal: See HPI and otherwise negative Genitourinary: No dysuria or change in urinary frequency Neurological: No headache, dizziness or syncope Musculoskeletal: No new muscle pain, positive aching in hips    Physical Exam:  Vital signs: BP 118/64   Pulse 74    Constitutional: NAD, Well developed, Well nourished, alert and cooperative Head:  Normocephalic and atraumatic. Eyes:  EOMs intact. No scleral icterus. Conjunctiva pink. Respiratory: Respirations even and unlabored. Lungs clear to auscultation bilaterally.  No wheezes, crackles, or rhonchi.  Cardiovascular:  Regular rate and rhythm. No peripheral edema, cyanosis or pallor.  Gastrointestinal:  Soft, nondistended, nontender. No rebound or guarding. Normal bowel sounds. No appreciable masses or hepatomegaly. Rectal:  Not performed.  Msk:  Symmetrical without gross deformities. Without edema, no deformity or joint abnormality.  Neurologic:  Alert and oriented x4;  grossly normal neurologically.  Skin:   Dry and intact without significant lesions or rashes. Psychiatric: Oriented to person, place and time. Demonstrates good judgement and reason without abnormal affect or behaviors.   RELEVANT LABS AND IMAGING: CBC    Component Value Date/Time   WBC 4.7 10/21/2023 0950   RBC 4.22 10/21/2023 0950   HGB 12.4 10/21/2023 0950   HCT 37.8 10/21/2023 0950   PLT 385.0 10/21/2023 0950   MCV 89.5 10/21/2023 0950   MCHC 32.9 10/21/2023 0950   RDW 14.6 10/21/2023 0950   LYMPHSABS 2.0 10/21/2023 0950   MONOABS 0.5  10/21/2023 0950   EOSABS 0.1 10/21/2023 0950   BASOSABS 0.0 10/21/2023 0950    CMP     Component Value Date/Time   NA 136 10/21/2023 0950   K 4.5 10/21/2023 0950   CL 102 10/21/2023 0950   CO2 29 10/21/2023 0950   GLUCOSE 87 10/21/2023 0950   BUN 12 10/21/2023 0950   CREATININE 0.75 10/21/2023 0950   CALCIUM 9.0 10/21/2023 0950   PROT 7.2 10/21/2023 0950   ALBUMIN 4.2 10/21/2023 0950   AST 22 10/21/2023 0950   ALT 15 10/21/2023 0950  ALKPHOS 62 10/21/2023 0950   BILITOT 0.2 10/21/2023 0950     Assessment/Plan:   Crohn's disease of both small and large intestine Rectal bleeding Diarrhea  Abdominal cramping Patient with history of Crohn's ileocolitis dating back to about 2007.  She has responded to mesalamine , but there have been times when she has stopped taking the medication in the past.  She ran out of her Lialda  a few months ago and did not refill, now presenting with fecal urgency, loose bloody stools, and abdominal cramping which has gradually worsened over the past month.  Suspect she is in an IBD flare due to stopping her Lialda . - Check labs today: CBC, CRP, ESR - Start Lialda  2.4g BID, 90-day supply, 3 refills. - Will hold off on prednisone  for now. Instructed to call if symptoms worsening or not improving. - Follow-up in 2 months  Valiant Gaul, PA-C [facility] 01/30/2024, 9:22 AM  Patient Care Team: Luevenia Saha, MD as PCP - General (Family Medicine) Kenney Peacemaker, MD as Consulting Physician (Gastroenterology) Ashby Lawman, MD as Consulting Physician (Obstetrics and Gynecology) Dermatology, Cyd Dowse, Jacqlyn Matas, DO as Consulting Physician (Obstetrics and Gynecology)

## 2024-02-26 ENCOUNTER — Other Ambulatory Visit: Payer: Self-pay

## 2024-03-25 ENCOUNTER — Ambulatory Visit: Admitting: Gastroenterology

## 2024-04-18 ENCOUNTER — Other Ambulatory Visit: Payer: Self-pay | Admitting: Family Medicine

## 2024-04-19 ENCOUNTER — Encounter: Payer: Self-pay | Admitting: Obstetrics and Gynecology

## 2024-04-19 ENCOUNTER — Ambulatory Visit (INDEPENDENT_AMBULATORY_CARE_PROVIDER_SITE_OTHER): Admitting: Obstetrics and Gynecology

## 2024-04-19 VITALS — BP 127/78 | HR 75 | Ht 66.2 in | Wt 177.6 lb

## 2024-04-19 DIAGNOSIS — M62838 Other muscle spasm: Secondary | ICD-10-CM | POA: Insufficient documentation

## 2024-04-19 DIAGNOSIS — R350 Frequency of micturition: Secondary | ICD-10-CM

## 2024-04-19 DIAGNOSIS — N393 Stress incontinence (female) (male): Secondary | ICD-10-CM | POA: Diagnosis not present

## 2024-04-19 DIAGNOSIS — N3281 Overactive bladder: Secondary | ICD-10-CM | POA: Diagnosis not present

## 2024-04-19 LAB — POCT URINALYSIS DIP (CLINITEK)
Bilirubin, UA: NEGATIVE
Blood, UA: NEGATIVE
Glucose, UA: NEGATIVE mg/dL
Ketones, POC UA: NEGATIVE mg/dL
Leukocytes, UA: NEGATIVE
Nitrite, UA: NEGATIVE
POC PROTEIN,UA: NEGATIVE
Spec Grav, UA: 1.02 (ref 1.010–1.025)
Urobilinogen, UA: 0.2 U/dL
pH, UA: 5.5 (ref 5.0–8.0)

## 2024-04-19 MED ORDER — CYCLOBENZAPRINE HCL 5 MG PO TABS: 5.0000 mg | ORAL_TABLET | Freq: Three times a day (TID) | ORAL | 1 refills | Status: AC | PRN

## 2024-04-19 NOTE — Assessment & Plan Note (Signed)
-   For treatment of stress urinary incontinence,  non-surgical options include expectant management, weight loss, physical therapy, as well as a pessary.  Surgical options include a midurethral sling, Burch urethropexy, and transurethral injection of a bulking agent. - Will start with pelvic PT

## 2024-04-19 NOTE — Assessment & Plan Note (Signed)
-   The origin of pelvic floor muscle spasm can be multifactorial, including primary, reactive to a different pain source, trauma, or even part of a centralized pain syndrome.Treatment options include pelvic floor physical therapy, local (vaginal) or oral  muscle relaxants, pelvic muscle trigger point injections or centrally acting pain medications.   - Referred to pelvic PT - Since she has more symptoms during her period, will try flexeril  5mg  to take as needed. Suggested starting it at night to prevent sedation. Should take when she is on her cycle.

## 2024-04-19 NOTE — Progress Notes (Signed)
 New Patient Evaluation and Consultation  Referring Provider: Dannielle Bouchard, DO PCP: Jodie Lavern CROME, MD Date of Service: 04/19/2024  SUBJECTIVE Chief Complaint: New Patient (Initial Visit) Shannon Singh is a 43 y.o. female is here for stress incontinence/)  History of Present Illness: Shannon Singh is a 42 y.o. White or Caucasian female seen in consultation at the request of Dr Dannielle for evaluation of incontinence.     Urinary Symptoms: Does not regularly leak urine. Sometimes notices her underwear is wet but has it more when her tampon is in. But not really aware of when it happens.  Leaks 1 times per day Does have leakage with exercise, sneeze, laughing. More subtle on the day to day.  Does not wear pads The leakage is bothersome. Has not been on medication for her bladder before.   Day time voids 10.  Nocturia: 1-2 times per night to void. Voiding has increased more in the last year Voiding dysfunction:  empties bladder well.  Patient does not use a catheter to empty bladder.  When urinating, patient feels she has no difficulties Drinks: 1 glass Black tea, occasional coffee, > 64oz water per day. Tries not to drink caffeine in the afternoon.   UTIs: 0 UTI's in the last year.   Denies history of blood in urine and kidney or bladder stones   Pelvic Organ Prolapse Symptoms:                  Patient Denies a feeling of a bulge the vaginal area.   Bowel Symptom: Bowel movements: 1-2 time(s) per day Stool consistency: soft  Straining: no.  Splinting: no.  Incomplete evacuation: no.  Patient Denies accidental bowel leakage / fecal incontinence Bowel regimen: diet and fiber  HM Colonoscopy          Upcoming     Colonoscopy (Every 3 Years) Next due on 01/20/2026    01/21/2023  COLONOSCOPY   Only the first 1 history entries have been loaded, but more history exists.                Sexual Function Sexually active: yes.  Sexual orientation: Straight Pain with  sex: No  Pelvic Pain Denies pelvic pain   Past Medical History:  Past Medical History:  Diagnosis Date   Allergy    Anemia    borderline   Asthma    exrecised induced   Chicken pox    Crohn's ileocolitis (HCC) 06/03/2012   Depression    Heart murmur    mild murmur   History of migraine headaches      Past Surgical History:   Past Surgical History:  Procedure Laterality Date   CESAREAN SECTION  12/2009   twins   COLONOSCOPY  06/25/2018   [facility]   COLONOSCOPY W/ BIOPSIES  2008, 2014   MULTIPLE TOOTH EXTRACTIONS     7th- 8th grade      Past OB/GYN History: OB History  Gravida Para Term Preterm AB Living  2 2 0 2  2  SAB IAB Ectopic Multiple Live Births     1     # Outcome Date GA Lbr Len/2nd Weight Sex Type Anes PTL Lv  2 Preterm      CS-Unspec     1 Preterm      CS-Unspec       Obstetric Comments  Patient had twins (girl) (boy)   Periods have been irregular lately- tried lolo estrin but stopped due to side effects,  started progesterone  only. Contraception: vasectomy. Last pap smear was April 2025- negative.    Medications: Patient has a current medication list which includes the following prescription(s): cyclobenzaprine , ferrous sulfate, levocetirizine, mesalamine , probiotic product, progesterone , spironolactone, zinc , and montelukast .   Allergies: Patient has no known allergies.   Social History:  Social History   Tobacco Use   Smoking status: Never   Smokeless tobacco: Never  Vaping Use   Vaping status: Never Used  Substance Use Topics   Alcohol use: Yes    Comment: 1 bottle wine/week, social   Drug use: No    Relationship status: married Patient lives with husband and 2 kids.   Patient is employed in [employer]. Regular exercise: Yes: daily walk, 30 min weights History of abuse: No  Family History:   Family History  Problem Relation Age of Onset   Colon polyps Father    Prostate cancer Maternal Grandfather    Heart disease Maternal  Grandmother    Healthy Daughter    Healthy Son    Breast cancer Neg Hx    Colon cancer Neg Hx    Ovarian cancer Neg Hx    Esophageal cancer Neg Hx    Rectal cancer Neg Hx    Stomach cancer Neg Hx      Review of Systems: Review of Systems  Constitutional:  Positive for malaise/fatigue. Negative for fever and weight loss.  Respiratory:  Negative for cough, shortness of breath and wheezing.   Cardiovascular:  Negative for chest pain, palpitations and leg swelling.  Gastrointestinal:  Positive for blood in stool. Negative for abdominal pain.  Genitourinary:  Negative for dysuria.  Musculoskeletal:  Negative for myalgias.  Skin:  Negative for rash.  Neurological:  Positive for headaches. Negative for dizziness.  Endo/Heme/Allergies:  Does not bruise/bleed easily.  Psychiatric/Behavioral:  Negative for depression. The patient is not nervous/anxious.      OBJECTIVE Physical Exam: Vitals:   04/19/24 1021  BP: 127/78  Pulse: 75  Weight: 177 lb 9.6 oz (80.6 kg)  Height: 5' 6.2 (1.681 m)    Physical Exam Vitals reviewed. Exam conducted with a chaperone present.  Constitutional:      General: She is not in acute distress. Pulmonary:     Effort: Pulmonary effort is normal.  Abdominal:     General: There is no distension.     Palpations: Abdomen is soft.     Tenderness: There is no abdominal tenderness. There is no rebound.  Musculoskeletal:        General: No swelling. Normal range of motion.  Skin:    General: Skin is warm and dry.     Findings: No rash.  Neurological:     Mental Status: She is alert and oriented to person, place, and time.  Psychiatric:        Mood and Affect: Mood normal.        Behavior: Behavior normal.      GU / Detailed Urogynecologic Evaluation:  Pelvic Exam: Normal external female genitalia; Bartholin's and Skene's glands normal in appearance; urethral meatus normal in appearance, no urethral masses or discharge.   CST: negative  Speculum  exam reveals normal vaginal mucosa without atrophy. Cervix normal appearance. Uterus normal single, nontender. Adnexa no mass, fullness, tenderness.     Pelvic floor strength I/V  Pelvic floor musculature: Right levator non-tender, Right obturator tender, Left levator tender, Left obturator tender  POP-Q:   POP-Q  -2.5  Aa   -2.5                                           Ba  -7.5                                              C   2                                            Gh  4                                            Pb  8                                            tvl   -3                                            Ap  -3                                            Bp  -8                                              D      Rectal Exam:  deferred  Post-Void Residual (PVR) by Bladder Scan: In order to evaluate bladder emptying, we discussed obtaining a postvoid residual and patient agreed to this procedure.  Procedure: The ultrasound unit was placed on the patient's abdomen in the suprapubic region after the patient had voided.    Post Void Residual - 04/19/24 1040       Post Void Residual   Post Void Residual 15 mL           Laboratory Results: Lab Results  Component Value Date   COLORU yellow 04/19/2024   CLARITYU clear 04/19/2024   GLUCOSEUR negative 04/19/2024   BILIRUBINUR negative 04/19/2024   SPECGRAV 1.020 04/19/2024   RBCUR negative 04/19/2024   PHUR 5.5 04/19/2024   PROTEINUR NEGATIVE 01/02/2010   UROBILINOGEN 0.2 04/19/2024   LEUKOCYTESUR Negative 04/19/2024    Lab Results  Component Value Date   CREATININE 0.66 01/30/2024   CREATININE 0.75 10/21/2023   CREATININE 0.80 10/06/2019    No results found for: HGBA1C  Lab Results  Component Value Date   HGB 13.0 01/30/2024     ASSESSMENT AND PLAN Ms. Tat is a 43 y.o. with:  1. Overactive bladder   2. Urinary frequency   3.  Levator spasm   4. SUI (stress  urinary incontinence, female)     Overactive bladder Assessment & Plan: - We discussed the symptoms of overactive bladder (OAB), which include urinary urgency, urinary frequency, nocturia, with or without urge incontinence.  While we do not know the exact etiology of OAB, several treatment options exist. We discussed management including behavioral therapy (decreasing bladder irritants, urge suppression strategies, timed voids, bladder retraining), physical therapy, medication; for refractory cases posterior tibial nerve stimulation, sacral neuromodulation, and intravesical botulinum toxin injection.  - She prefers to start with pelvic PT, referral placed. Suspect that some of her symptoms are being exacerbated by pelvic floor muscle tension.  - Advised to also reduce caffeine intake  Orders: -     AMB referral to rehabilitation  Urinary frequency -     POCT URINALYSIS DIP (CLINITEK)  Levator spasm Assessment & Plan: - The origin of pelvic floor muscle spasm can be multifactorial, including primary, reactive to a different pain source, trauma, or even part of a centralized pain syndrome.Treatment options include pelvic floor physical therapy, local (vaginal) or oral  muscle relaxants, pelvic muscle trigger point injections or centrally acting pain medications.   - Referred to pelvic PT - Since she has more symptoms during her period, will try flexeril  5mg  to take as needed. Suggested starting it at night to prevent sedation. Should take when she is on her cycle.   Orders: -     Cyclobenzaprine  HCl; Take 1 tablet (5 mg total) by mouth 3 (three) times daily as needed for muscle spasms.  Dispense: 30 tablet; Refill: 1 -     AMB referral to rehabilitation  SUI (stress urinary incontinence, female) Assessment & Plan: - For treatment of stress urinary incontinence,  non-surgical options include expectant management, weight loss, physical therapy, as well as a  pessary.  Surgical options include a midurethral sling, Burch urethropexy, and transurethral injection of a bulking agent. - Will start with pelvic PT  Orders: -     AMB referral to rehabilitation   Return 4 months  Rosaline LOISE Caper, MD

## 2024-04-19 NOTE — Patient Instructions (Signed)

## 2024-04-19 NOTE — Assessment & Plan Note (Addendum)
-   We discussed the symptoms of overactive bladder (OAB), which include urinary urgency, urinary frequency, nocturia, with or without urge incontinence.  While we do not know the exact etiology of OAB, several treatment options exist. We discussed management including behavioral therapy (decreasing bladder irritants, urge suppression strategies, timed voids, bladder retraining), physical therapy, medication; for refractory cases posterior tibial nerve stimulation, sacral neuromodulation, and intravesical botulinum toxin injection.  - She prefers to start with pelvic PT, referral placed. Suspect that some of her symptoms are being exacerbated by pelvic floor muscle tension.  - Advised to also reduce caffeine intake

## 2024-04-28 ENCOUNTER — Ambulatory Visit: Admitting: Physical Therapy

## 2024-06-15 ENCOUNTER — Ambulatory Visit

## 2024-06-17 ENCOUNTER — Ambulatory Visit: Admitting: Physical Therapy

## 2024-08-05 ENCOUNTER — Ambulatory Visit: Attending: Obstetrics and Gynecology | Admitting: Physical Therapy

## 2024-08-05 DIAGNOSIS — M6281 Muscle weakness (generalized): Secondary | ICD-10-CM | POA: Diagnosis present

## 2024-08-05 DIAGNOSIS — N393 Stress incontinence (female) (male): Secondary | ICD-10-CM | POA: Diagnosis present

## 2024-08-05 DIAGNOSIS — R293 Abnormal posture: Secondary | ICD-10-CM | POA: Insufficient documentation

## 2024-08-05 DIAGNOSIS — R279 Unspecified lack of coordination: Secondary | ICD-10-CM | POA: Insufficient documentation

## 2024-08-05 NOTE — Therapy (Unsigned)
 OUTPATIENT PHYSICAL THERAPY FEMALE PELVIC EVALUATION   Patient Name: Shannon Singh MRN: [id number] DOB:Jan 20, 1981, 43 y.o., female Today's Date: 08/06/2024  END OF SESSION:  PT End of Session - 08/06/24 1323     Visit Number 1    Date for Recertification  02/02/25    Authorization Type aetna    PT Start Time 1532    PT Stop Time 1615    PT Time Calculation (min) 43 min    Activity Tolerance Patient tolerated treatment well    Behavior During Therapy [facility] for tasks assessed/performed          Past Medical History:  Diagnosis Date   Allergy    Anemia    borderline   Asthma    exrecised induced   Chicken pox    Crohn's ileocolitis (HCC) 06/03/2012   Depression    Heart murmur    mild murmur   History of migraine headaches    Past Surgical History:  Procedure Laterality Date   CESAREAN SECTION  12/2009   twins   COLONOSCOPY  06/25/2018   Gessner   COLONOSCOPY W/ BIOPSIES  2008, 2014   MULTIPLE TOOTH EXTRACTIONS     7th- 8th grade    Patient Active Problem List   Diagnosis Date Noted   Overactive bladder 04/19/2024   Levator spasm 04/19/2024   SUI (stress urinary incontinence, female) 04/19/2024   Chronic allergic rhinitis 10/21/2023   Irritable bowel syndrome 06/01/2020   Oral contraceptive use 08/28/2018   Acne 11/28/2017   History of systemic steroid therapy 07/26/2013   Crohn disease (HCC) 08/31/2012   Crohn's ileocolitis (HCC) 06/03/2012   Migraine headache 10/03/2007    PCP: Jodie Lavern LITTIE, MD   REFERRING PROVIDER: Marilynne Rosaline SAILOR, MD  REFERRING DIAG: N32.81 (ICD-10-CM) - Overactive bladder N39.3 (ICD-10-CM) - SUI (stress urinary incontinence, female) [phone] (ICD-10-CM) - Levator spasm THERAPY DIAG:  Muscle weakness (generalized)  Stress incontinence  Abnormal posture  Unspecified lack of coordination  Rationale for Evaluation and Treatment: Rehabilitation  ONSET DATE: chronic  SUBJECTIVE:                                                                                                                                                                                            SUBJECTIVE STATEMENT: Reports urinary incontinence with stressors (sneezing, coughing, working out with squats and core,, also has increased urgency, frequency to around 1.5 hours   Fluid intake: several bottles of water  FUNCTIONAL LIMITATIONS: road trip/longer drives, coughing, sneezing, working out  PERTINENT HISTORY:  Medications for current condition: no  Surgeries: c-section with twins 43yo  Other:  Sexual  abuse: No  PAIN:  Are you having pain? No   PRECAUTIONS: None  RED FLAGS: None   WEIGHT BEARING RESTRICTIONS: No  FALLS:  Has patient fallen in last 6 months? No  OCCUPATION: pharm med sales  ACTIVITY LEVEL : moderate   PLOF: Independent  PATIENT GOALS: to have no leakage, and normal urinary frequency    BOWEL MOVEMENT: Pain with bowel movement: No Type of bowel movement:Type (Bristol Stool Scale) 2-4, Frequency every other day to every 3rd day, and Strain no Fully empty rectum: Yes:   Leakage: No                                                  Caused by:  Bowel urgency: no Pads: No Fiber supplement/laxative fiber powder   URINATION: Pain with urination: No Fully empty bladder: Yes: did have a little residual urine left with exam with urogyn per pt                                          Post-void dribble: No Stream: Strong Urgency: Yes  Frequency:during the day around 1. hours                                                        Nocturia: Yes:  1x   Leakage: Coughing, Sneezing, and Exercise Pads/briefs: No  INTERCOURSE:  Ability to have vaginal penetration Yes  Pain with intercourse: none Dryness: No Climax: no concerns  Marinoff Scale: 0/3  PREGNANCY:  C-section deliveries 1 Currently pregnant No  PROLAPSE: None   OBJECTIVE:  Note: Objective measures were completed at Evaluation  unless otherwise noted.  DIAGNOSTIC FINDINGS:  Post-void residual: Voiding Cystourethrogram (VCUG):  Ultrasound:   COGNITION: Overall cognitive status: Within functional limits for tasks assessed     SENSATION: Light touch: Appears intact   FUNCTIONAL TESTS:   Single leg stance:mild hip drop bil   Rt:  Lt: Sit-up test: Squat: Bed mobility:  GAIT:WFL  POSTURE: rounded shoulders and posterior pelvic tilt   LUMBARAROM/PROM:  A/PROM A/PROM  Eval (% available)  Flexion   Extension   Right lateral flexion   Left lateral flexion   Right rotation   Left rotation    (Blank rows = not tested)  LOWER EXTREMITY ROM:  Bil hamstrings limited by 25%  LOWER EXTREMITY MMT:  Bil hips grossly 4+/5 PALPATION:  General: tightness noted in bil lumbar paraspinals   Pelvic Alignment: WFL  Abdominal: no TTP  Diastasis: No Distortion: No  Breathing: chest but able to improve to 360 with cue Scar tissue: No Active Straight Leg Raise: mild instability noted bil                 External Perineal Exam: mild dryness at vulva                              Internal Pelvic Floor: TTP throughout bil deep layers more so at obturators   Patient confirms identification and approves PT to assess internal pelvic  floor and treatment Yes No emotional/communication barriers or cognitive limitation. Patient is motivated to learn. Patient understands and agrees with treatment goals and plan. PT explains patient will be examined in standing, sitting, and lying down to see how their muscles and joints work. When they are ready, they will be asked to remove their underwear so PT can examine their perineum. The patient is also given the option of providing their own chaperone as one is not provided in our facility. The patient also has the right and is explained the right to defer or refuse any part of the evaluation or treatment including the internal exam. With the patient's consent, PT will use  one gloved finger to gently assess the muscles of the pelvic floor, seeing how well it contracts and relaxes and if there is muscle symmetry. After, the patient will get dressed and PT and patient will discuss exam findings and plan of care. PT and patient discuss plan of care, schedule, attendance policy and HEP activities.  All internal or external pelvic floor assessments and/or treatments are completed with proper hand hygiene and gloves hands. If needed gloves are changed with hand hygiene during patient care time.  PELVIC MMT:   MMT eval  Vaginal 1/5  Internal Anal Sphincter   External Anal Sphincter   Puborectalis   (Blank rows = not tested)        TONE: Increased   PROLAPSE: Not seen in hooklying with cough   TODAY'S TREATMENT:                                                                                                                              DATE:   EVAL Examination completed, findings reviewed, pt educated on POC, HEP. Pt motivated to participate in PT and agreeable to attempt recommendations.     PATIENT EDUCATION:  Education details: [id number] Person educated: Patient Education method: Explanation, Demonstration, Actor cues, Verbal cues, and Handouts Education comprehension: verbalized understanding, returned demonstration, verbal cues required, tactile cues required, and needs further education  HOME EXERCISE PROGRAM: [id number]  ASSESSMENT:  CLINICAL IMPRESSION: Patient is a 43 y.o. female  who was seen today for physical therapy evaluation and treatment for SUI, increased urinary frequency and urgency and reports having some dryness vaginally. Pt demonstrated mildly decreased strength at core and hips, decreased flexibility in spine and hips. Patient consented to internal pelvic floor assessment vaginally this date and found to have decreased strength, endurance, and coordination and increased tension in deep layers. Patient benefited from max verbal cues for  improved technique with pelvic floor contractions and relaxation with pt able to achieve 1/5 strength.   OBJECTIVE IMPAIRMENTS: decreased activity tolerance, decreased coordination, decreased endurance, decreased mobility, decreased strength, increased fascial restrictions, increased muscle spasms, impaired flexibility, and improper body mechanics.   ACTIVITY LIMITATIONS: continence  PARTICIPATION LIMITATIONS: interpersonal relationship  PERSONAL FACTORS: Time since onset of injury/illness/exacerbation and 1 comorbidity: reports being in perimenopause  are also  affecting patient's functional outcome.   REHAB POTENTIAL: Good  CLINICAL DECISION MAKING: Stable/uncomplicated  EVALUATION COMPLEXITY: Low   GOALS: Goals reviewed with patient? Yes  SHORT TERM GOALS: Target date: 09/02/24  Pt to be I with HEP for carry over and continuing recommendations for improved outcomes.   Baseline: Goal status: INITIAL  2.  Pt will be independent with the knack, urge suppression technique, and double voiding in order to improve bladder habits and decrease urinary incontinence.   Baseline:  Goal status: INITIAL  LONG TERM GOALS: Target date: 13   Pt to be I with advanced  HEP for carry over and continuing recommendations for improved outcomes.   Baseline:  Goal status: INITIAL  2.  Pt will be able to functional actions such as a full 30 min workout without leakage to complete these with improved confidence  Baseline:  Goal status: INITIAL  3.  Pt to report no more than 1 instance of urinary incontinence in a week for improved confidence with community outings.  Baseline:  Goal status: INITIAL  4.  Pt to demonstrate full ROM of pelvic floor for improved ability to coordinate pelvic floor without exercise to decreased leakage.   Baseline:  Goal status: INITIAL   PLAN:  PT FREQUENCY: 1x/week  PT DURATION: 10 sessions  PLANNED INTERVENTIONS: 97110-Therapeutic exercises, 97530-  Therapeutic activity, 97112- Neuromuscular re-education, 97535- Self Care, [id number]- Manual therapy, [phone]- Canalith repositioning, [id number]- Aquatic Therapy, [phone]- Electrical stimulation (manual), [id number]- Vasopneumatic device, [phone] (1-2 muscles), [phone] (3+ muscles)- Dry Needling, Patient/Family education, Taping, Joint mobilization, Spinal mobilization, Scar mobilization, DME instructions, Cryotherapy, Moist heat, and Biofeedback  PLAN FOR NEXT SESSION: internal manual for improved tissue mobility and promote relaxation of pelvic floor, diaphragmatic breathing, down training, voiding mechanics, urge drill/knack    Darryle Navy, PT, DPT 11/14/251:24 PM  [facility] [address] [place], [place] [address] Phone # [phone] Fax [phone]

## 2024-08-06 ENCOUNTER — Other Ambulatory Visit: Payer: Self-pay

## 2024-08-25 ENCOUNTER — Ambulatory Visit: Admitting: Obstetrics and Gynecology

## 2024-09-29 ENCOUNTER — Ambulatory Visit: Attending: Obstetrics and Gynecology | Admitting: Physical Therapy

## 2024-09-29 DIAGNOSIS — R293 Abnormal posture: Secondary | ICD-10-CM | POA: Insufficient documentation

## 2024-09-29 DIAGNOSIS — N393 Stress incontinence (female) (male): Secondary | ICD-10-CM | POA: Insufficient documentation

## 2024-09-29 DIAGNOSIS — M6281 Muscle weakness (generalized): Secondary | ICD-10-CM | POA: Insufficient documentation

## 2024-09-29 DIAGNOSIS — R279 Unspecified lack of coordination: Secondary | ICD-10-CM | POA: Insufficient documentation

## 2024-10-04 ENCOUNTER — Encounter: Payer: Self-pay | Admitting: *Deleted

## 2024-10-13 ENCOUNTER — Other Ambulatory Visit: Payer: Self-pay | Admitting: Otolaryngology

## 2024-10-13 ENCOUNTER — Ambulatory Visit: Admitting: Physical Therapy

## 2024-10-13 DIAGNOSIS — E041 Nontoxic single thyroid nodule: Secondary | ICD-10-CM

## 2024-10-15 ENCOUNTER — Ambulatory Visit
Admission: RE | Admit: 2024-10-15 | Discharge: 2024-10-15 | Disposition: A | Source: Ambulatory Visit | Attending: Otolaryngology | Admitting: Otolaryngology

## 2024-10-15 ENCOUNTER — Encounter: Payer: Self-pay | Admitting: Otolaryngology

## 2024-10-15 DIAGNOSIS — E041 Nontoxic single thyroid nodule: Secondary | ICD-10-CM

## 2024-10-21 ENCOUNTER — Encounter: Payer: 59 | Admitting: Family Medicine

## 2024-10-27 ENCOUNTER — Ambulatory Visit: Admitting: Physical Therapy

## 2024-11-10 ENCOUNTER — Ambulatory Visit: Admitting: Physical Therapy
